# Patient Record
Sex: Female | Born: 1985 | Race: White | Hispanic: No | Marital: Married | Smoking: Never smoker
Health system: Southern US, Community
[De-identification: ages and names within clinical notes are randomized; demographics above are authoritative.]

## PROBLEM LIST (undated history)

## (undated) DIAGNOSIS — Z3400 Encounter for supervision of normal first pregnancy, unspecified trimester: Secondary | ICD-10-CM

## (undated) DIAGNOSIS — Z8709 Personal history of other diseases of the respiratory system: Secondary | ICD-10-CM

## (undated) DIAGNOSIS — G44099 Other trigeminal autonomic cephalgias (TAC), not intractable: Secondary | ICD-10-CM

## (undated) DIAGNOSIS — E162 Hypoglycemia, unspecified: Secondary | ICD-10-CM

## (undated) DIAGNOSIS — T4145XA Adverse effect of unspecified anesthetic, initial encounter: Secondary | ICD-10-CM

## (undated) DIAGNOSIS — D649 Anemia, unspecified: Secondary | ICD-10-CM

## (undated) DIAGNOSIS — T7840XA Allergy, unspecified, initial encounter: Secondary | ICD-10-CM

## (undated) DIAGNOSIS — G500 Trigeminal neuralgia: Secondary | ICD-10-CM

## (undated) DIAGNOSIS — Z3483 Encounter for supervision of other normal pregnancy, third trimester: Secondary | ICD-10-CM

## (undated) DIAGNOSIS — T8859XA Other complications of anesthesia, initial encounter: Secondary | ICD-10-CM

## (undated) HISTORY — PX: WISDOM TOOTH EXTRACTION: SHX21

## (undated) HISTORY — DX: Other complications of anesthesia, initial encounter: T88.59XA

## (undated) HISTORY — DX: Personal history of other diseases of the respiratory system: Z87.09

## (undated) HISTORY — PX: MYRINGOTOMY: SUR874

## (undated) HISTORY — DX: Allergy, unspecified, initial encounter: T78.40XA

## (undated) HISTORY — DX: Anemia, unspecified: D64.9

## (undated) HISTORY — DX: Adverse effect of unspecified anesthetic, initial encounter: T41.45XA

## (undated) HISTORY — DX: Hypoglycemia, unspecified: E16.2

## (undated) HISTORY — PX: TONSILLECTOMY: SUR1361

---
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
## 2005-11-28 ENCOUNTER — Emergency Department (HOSPITAL_COMMUNITY): Admission: EM | Admit: 2005-11-28 | Discharge: 2005-11-29 | Payer: Self-pay | Admitting: Emergency Medicine

## 2011-06-13 ENCOUNTER — Emergency Department (HOSPITAL_COMMUNITY)
Admission: EM | Admit: 2011-06-13 | Discharge: 2011-06-13 | Disposition: A | Discharge status: Home / Self Care | Payer: Self-pay | Source: Home / Self Care | Attending: Emergency Medicine | Admitting: Emergency Medicine

## 2011-06-13 ENCOUNTER — Encounter: Payer: Self-pay | Admitting: *Deleted

## 2011-06-13 DIAGNOSIS — H6692 Otitis media, unspecified, left ear: Secondary | ICD-10-CM

## 2011-06-13 DIAGNOSIS — H6690 Otitis media, unspecified, unspecified ear: Secondary | ICD-10-CM

## 2011-06-13 HISTORY — DX: Other trigeminal autonomic cephalgias (tac), not intractable: G44.099

## 2011-06-13 MED ORDER — TRAMADOL HCL 50 MG PO TABS: 100.0000 mg | ORAL_TABLET | Freq: Three times a day (TID) | ORAL | Status: AC | PRN

## 2011-06-13 MED ORDER — AMOXICILLIN 500 MG PO CAPS: 1000.0000 mg | ORAL_CAPSULE | Freq: Three times a day (TID) | ORAL | Status: AC

## 2011-06-13 NOTE — ED Notes (Signed)
Cough and congestion onset Wednesday onset of left ear pain last night -

## 2011-06-13 NOTE — ED Provider Notes (Signed)
Chief Complaint  Patient presents with  . Otalgia  . Nasal Congestion  . Cough    History of Present Illness:  Jeanice Lim has had a two-day history of left ear pain, pressure, dry cough, sore throat, and hoarseness. She denies fever, chills, nasal congestion, rhinorrhea, drainage from the ear, difficulty breathing, nausea, or vomiting.  Review of Systems:  Other than noted above, the patient denies any of the following symptoms. Systemic:  No fever, chills, sweats, fatigue, myalgias, headache, or anorexia. Eye:  No redness, pain or drainage. ENT:  No earache, nasal congestion, rhinorrhea, sinus pressure, or sore throat. Lungs:  No cough, sputum production, wheezing, shortness of breath. Or chest pain. GI:  No nausea, vomiting, abdominal pain or diarrhea. Skin:  No rash or itching.  PMFSH:  Past medical history, family history, social history, meds, and allergies were reviewed.  Physical Exam:   Vital signs:  BP 135/70  Pulse 93  Temp(Src) 98.1 F (36.7 C) (Oral)  Resp 16  SpO2 100%  LMP 05/30/2011 General:  Alert, in no distress. Eye:  No conjunctival injection or drainage. ENT:  The left TM was red and dull. The right TM was normal. There was no fluid or pus behind either TM.  Nasal mucosa was clear and uncongested, without drainage.  Mucous membranes were moist.  Pharynx was clear, without exudate or drainage.  There were no oral ulcerations or lesions. Neck:  Supple, no adenopathy, tenderness or mass. Lungs:  No respiratory distress.  Lungs were clear to auscultation, without wheezes, rales or rhonchi.  Breath sounds were clear and equal bilaterally. Heart:  Regular rhythm, without gallops, murmers or rubs. Skin:  Clear, warm, and dry, without rash or lesions.  Labs:  No results found for this or any previous visit.   Radiology:  No results found.  Medications given in UCC:  None  Assessment:   Diagnoses that have been ruled out:  Diagnoses that are still under consideration:   Final diagnoses:  Left otitis media     Plan:   1.  The following meds were prescribed:   New Prescriptions   AMOXICILLIN (AMOXIL) 500 MG CAPSULE    Take 2 capsules (1,000 mg total) by mouth 3 (three) times daily.   TRAMADOL (ULTRAM) 50 MG TABLET    Take 2 tablets (100 mg total) by mouth every 8 (eight) hours as needed for pain.   2.  The patient was instructed in symptomatic care and handouts were given. 3.  The patient was told to return if becoming worse in any way, if no better in 3 or 4 days, and given some red flag symptoms that would indicate earlier return.   Roque Lias, MD 06/13/11 2119

## 2011-09-08 ENCOUNTER — Encounter: Payer: Self-pay | Admitting: *Deleted

## 2012-06-08 NOTE — L&D Delivery Note (Signed)
Delivery Note At 9:25 [name] a viable and healthy female was delivered via Vaginal, Spontaneous Delivery (Presentation: OA;LOT ).  APGAR: 8, 9; weight P .   Placenta status: Intact, Spontaneous.  Cord: 3 vessels with the following complications: None.    Anesthesia: Epidural  Episiotomy: None Lacerations: 2nd degree Suture Repair: 3.0 vicryl rapide Est. Blood Loss (mL):   Mom to postpartum.  Baby to stay with mommy and daddy.  [name],Lisa [name], Lisa [name]  O+/Br/contra?

## 2012-07-29 ENCOUNTER — Inpatient Hospital Stay (HOSPITAL_COMMUNITY): Admission: AD | Admit: 2012-07-29 | Payer: Self-pay | Source: Ambulatory Visit | Admitting: Obstetrics and Gynecology

## 2012-09-15 ENCOUNTER — Encounter (HOSPITAL_COMMUNITY): Payer: Self-pay

## 2012-09-15 DIAGNOSIS — Z3400 Encounter for supervision of normal first pregnancy, unspecified trimester: Secondary | ICD-10-CM

## 2012-09-15 HISTORY — DX: Encounter for supervision of normal first pregnancy, unspecified trimester: Z34.00

## 2012-09-15 NOTE — H&P (Signed)
ZAMORA COLTON is a 27 y.o. female G1P0 at 28+ for iol secondary to term pregnancy, favorable cervix.  +FM, no LOF, no VB, occ ctx.  SVE 2cm.  Pregnancy relatively uncomplicated, S<D with good growth on [place].    Maternal Medical History:  Contractions: Frequency: irregular.    Fetal activity: Perceived fetal activity is normal.    Prenatal complications: no prenatal complications Prenatal Complications - Diabetes: none.    OB History   Grav Para Term Preterm Abortions TAB SAB Ect Mult Living                G1 present, no abn pap, no STDs  Past Medical History  Diagnosis Date  . Headache, trigeminal autonomic   . Hypoglycemia   . Normal pregnancy, first 09/15/2012   Past Surgical History  Procedure Laterality Date  . Tonsillectomy    . Myringotomy    WTE  Family History: family history includes Diabetes in her mother.HTN, leukemia and lung CA Social History:  reports that she has never smoked. She does not have any smokeless tobacco history on file. 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   HENT: Negative.   Eyes: Negative.   Respiratory: Negative.   Cardiovascular: Negative.   Gastrointestinal: Negative.   Genitourinary: Negative.   Musculoskeletal: Negative.   Skin: Negative.   Neurological: Negative.   Psychiatric/Behavioral: Negative.       There were no vitals taken for this visit. Maternal Exam:  Abdomen: Fundal height is 37cm.   Estimated fetal weight is  8#.   Fetal presentation: vertex  Introitus: Normal vulva. Normal vagina.  Pelvis: adequate for delivery.   Cervix: Cervix evaluated by digital exam.     Physical Exam  Constitutional: She is oriented to person, place, and time. She appears well-developed and well-nourished.  HENT:  Head: Normocephalic and atraumatic.  Cardiovascular: Normal rate and regular rhythm.   Respiratory: Effort normal and breath sounds normal. No respiratory distress.  GI: Soft. Bowel sounds are normal. There is no tenderness.  Musculoskeletal: Normal range of motion.  Neurological: She is alert and oriented to person, place, and time.  Skin: Skin is warm and dry.  Psychiatric: She has a normal mood and affect. Her behavior is normal.    Prenatal labs: ABO, Rh:  O+ Antibody:  neg Rubella:  immune RPR:   NR HBsAg:   neg HIV:   neg GBS:   neg  Hgb 12.8/PapWNL/Ur Cx neg/ Plt 251K/ GC neg/ Chl neg/ First Tri Screen WNL/ AFP WNL/glucola WNL  [place] cwd 8 wk, EDC 09/15/12 Nl anat, x isolated CP cyst, female 34 wk [place] S<D AFI ULN, post plac, vtx, female EFW 6#3  Assessment/Plan: 27yo G1P0 at 40+ iol AROM/pitocin gbbs neg - no prophylaxis Epidural prn Expect SVD   [name],Mennie Spiller 09/15/2012, 9:52 PM

## 2012-09-16 ENCOUNTER — Inpatient Hospital Stay (HOSPITAL_COMMUNITY): Payer: 59 | Admitting: Anesthesiology

## 2012-09-16 ENCOUNTER — Encounter (HOSPITAL_COMMUNITY): Payer: Self-pay

## 2012-09-16 ENCOUNTER — Encounter (HOSPITAL_COMMUNITY): Payer: Self-pay | Admitting: Anesthesiology

## 2012-09-16 ENCOUNTER — Inpatient Hospital Stay (HOSPITAL_COMMUNITY)
Admission: RE | Admit: 2012-09-16 | Discharge: 2012-09-18 | DRG: 775 | Disposition: A | Discharge status: Home / Self Care | Payer: 59 | Source: Ambulatory Visit | Attending: Obstetrics and Gynecology | Admitting: Obstetrics and Gynecology

## 2012-09-16 VITALS — BP 130/85 | HR 91 | Temp 98.4°F | Resp 18 | Ht 72.0 in | Wt 187.0 lb

## 2012-09-16 DIAGNOSIS — Z3403 Encounter for supervision of normal first pregnancy, third trimester: Secondary | ICD-10-CM

## 2012-09-16 HISTORY — DX: Encounter for supervision of normal first pregnancy, unspecified trimester: Z34.00

## 2012-09-16 LAB — OB RESULTS CONSOLE ANTIBODY SCREEN: Antibody Screen: NEGATIVE

## 2012-09-16 LAB — OB RESULTS CONSOLE ABO/RH: RH Type: POSITIVE

## 2012-09-16 LAB — CBC
MCHC: 34.6 g/dL (ref 30.0–36.0)
Platelets: 203 10*3/uL (ref 150–400)
RDW: 13.7 % (ref 11.5–15.5)
WBC: 11.7 10*3/uL — ABNORMAL HIGH (ref 4.0–10.5)

## 2012-09-16 LAB — OB RESULTS CONSOLE GBS: GBS: NEGATIVE

## 2012-09-16 LAB — RPR: RPR Ser Ql: NONREACTIVE

## 2012-09-16 MED ORDER — PRENATAL MULTIVITAMIN CH
1.0000 | ORAL_TABLET | Freq: Every day | ORAL | Status: DC
  Administered 2012-09-17: 1 via ORAL
  Filled 2012-09-16: qty 1

## 2012-09-16 MED ORDER — LACTATED RINGERS IV SOLN: 500.0000 mL | Freq: Once | INTRAVENOUS | Status: DC

## 2012-09-16 MED ORDER — BENZOCAINE-MENTHOL 20-0.5 % EX AERO
1.0000 "application " | INHALATION_SPRAY | CUTANEOUS | Status: DC | PRN
  Administered 2012-09-17: 1 via TOPICAL
  Filled 2012-09-16: qty 56

## 2012-09-16 MED ORDER — LACTATED RINGERS IV SOLN
500.0000 mL | INTRAVENOUS | Status: DC | PRN
  Administered 2012-09-16: 500 mL via INTRAVENOUS

## 2012-09-16 MED ORDER — ACETAMINOPHEN 325 MG PO TABS: 650.0000 mg | ORAL_TABLET | ORAL | Status: DC | PRN

## 2012-09-16 MED ORDER — LANOLIN HYDROUS EX OINT: TOPICAL_OINTMENT | CUTANEOUS | Status: DC | PRN

## 2012-09-16 MED ORDER — ONDANSETRON HCL 4 MG/2ML IJ SOLN
4.0000 mg | Freq: Four times a day (QID) | INTRAMUSCULAR | Status: DC | PRN
  Administered 2012-09-16: 4 mg via INTRAVENOUS
  Filled 2012-09-16: qty 2

## 2012-09-16 MED ORDER — LACTATED RINGERS IV SOLN: INTRAVENOUS | Status: DC

## 2012-09-16 MED ORDER — FENTANYL 2.5 MCG/ML BUPIVACAINE 1/10 % EPIDURAL INFUSION (WH - ANES)
14.0000 mL/h | INTRAMUSCULAR | Status: DC | PRN
  Filled 2012-09-16: qty 125

## 2012-09-16 MED ORDER — ZOLPIDEM TARTRATE 5 MG PO TABS: 5.0000 mg | ORAL_TABLET | Freq: Every evening | ORAL | Status: DC | PRN

## 2012-09-16 MED ORDER — FAMOTIDINE 20 MG PO TABS
20.0000 mg | ORAL_TABLET | Freq: Every day | ORAL | Status: DC
  Administered 2012-09-17: 20 mg via ORAL
  Filled 2012-09-16: qty 1

## 2012-09-16 MED ORDER — TERBUTALINE SULFATE 1 MG/ML IJ SOLN: 0.2500 mg | Freq: Once | INTRAMUSCULAR | Status: AC | PRN

## 2012-09-16 MED ORDER — LIDOCAINE HCL (PF) 1 % IJ SOLN
INTRAMUSCULAR | Status: DC | PRN
  Administered 2012-09-16 (×2): 5 mL

## 2012-09-16 MED ORDER — OXYTOCIN 40 UNITS IN LACTATED RINGERS INFUSION - SIMPLE MED
1.0000 m[IU]/min | INTRAVENOUS | Status: DC
  Administered 2012-09-16: 2 m[IU]/min via INTRAVENOUS
  Administered 2012-09-16: 999 m[IU]/min via INTRAVENOUS
  Filled 2012-09-16: qty 1000

## 2012-09-16 MED ORDER — WITCH HAZEL-GLYCERIN EX PADS: 1.0000 "application " | MEDICATED_PAD | CUTANEOUS | Status: DC | PRN

## 2012-09-16 MED ORDER — ONDANSETRON HCL 4 MG PO TABS: 4.0000 mg | ORAL_TABLET | ORAL | Status: DC | PRN

## 2012-09-16 MED ORDER — OXYTOCIN BOLUS FROM INFUSION: 500.0000 mL | INTRAVENOUS | Status: DC

## 2012-09-16 MED ORDER — DIPHENHYDRAMINE HCL 25 MG PO CAPS: 25.0000 mg | ORAL_CAPSULE | Freq: Four times a day (QID) | ORAL | Status: DC | PRN

## 2012-09-16 MED ORDER — IBUPROFEN 600 MG PO TABS
600.0000 mg | ORAL_TABLET | Freq: Four times a day (QID) | ORAL | Status: DC | PRN
  Filled 2012-09-16 (×2): qty 1

## 2012-09-16 MED ORDER — IBUPROFEN 600 MG PO TABS
600.0000 mg | ORAL_TABLET | Freq: Four times a day (QID) | ORAL | Status: DC
  Administered 2012-09-17 – 2012-09-18 (×6): 600 mg via ORAL
  Filled 2012-09-16 (×5): qty 1

## 2012-09-16 MED ORDER — SODIUM BICARBONATE 8.4 % IV SOLN
INTRAVENOUS | Status: DC | PRN
  Administered 2012-09-16: 8 mL via EPIDURAL

## 2012-09-16 MED ORDER — EPHEDRINE 5 MG/ML INJ
10.0000 mg | INTRAVENOUS | Status: DC | PRN
  Filled 2012-09-16: qty 2
  Filled 2012-09-16: qty 4

## 2012-09-16 MED ORDER — SIMETHICONE 80 MG PO CHEW: 80.0000 mg | CHEWABLE_TABLET | ORAL | Status: DC | PRN

## 2012-09-16 MED ORDER — BUTORPHANOL TARTRATE 1 MG/ML IJ SOLN: 2.0000 mg | INTRAMUSCULAR | Status: DC | PRN

## 2012-09-16 MED ORDER — LACTATED RINGERS IV SOLN
INTRAVENOUS | Status: DC
  Administered 2012-09-16: 13:00:00 via INTRAUTERINE

## 2012-09-16 MED ORDER — OXYCODONE-ACETAMINOPHEN 5-325 MG PO TABS
1.0000 | ORAL_TABLET | ORAL | Status: DC | PRN
  Administered 2012-09-17: 1 via ORAL
  Filled 2012-09-16: qty 1

## 2012-09-16 MED ORDER — EPHEDRINE 5 MG/ML INJ
10.0000 mg | INTRAVENOUS | Status: DC | PRN
  Filled 2012-09-16: qty 2

## 2012-09-16 MED ORDER — OXYTOCIN 40 UNITS IN LACTATED RINGERS INFUSION - SIMPLE MED: 62.5000 mL/h | INTRAVENOUS | Status: DC

## 2012-09-16 MED ORDER — OXYCODONE-ACETAMINOPHEN 5-325 MG PO TABS: 1.0000 | ORAL_TABLET | ORAL | Status: DC | PRN

## 2012-09-16 MED ORDER — PHENYLEPHRINE 40 MCG/ML (10ML) SYRINGE FOR IV PUSH (FOR BLOOD PRESSURE SUPPORT)
80.0000 ug | PREFILLED_SYRINGE | INTRAVENOUS | Status: DC | PRN
  Filled 2012-09-16: qty 2

## 2012-09-16 MED ORDER — LIDOCAINE HCL (PF) 1 % IJ SOLN
30.0000 mL | INTRAMUSCULAR | Status: DC | PRN
  Filled 2012-09-16 (×2): qty 30

## 2012-09-16 MED ORDER — LACTATED RINGERS IV SOLN
INTRAVENOUS | Status: DC
  Administered 2012-09-16 (×2): via INTRAVENOUS

## 2012-09-16 MED ORDER — PHENYLEPHRINE 40 MCG/ML (10ML) SYRINGE FOR IV PUSH (FOR BLOOD PRESSURE SUPPORT)
80.0000 ug | PREFILLED_SYRINGE | INTRAVENOUS | Status: DC | PRN
  Filled 2012-09-16: qty 2
  Filled 2012-09-16: qty 5

## 2012-09-16 MED ORDER — CITRIC ACID-SODIUM CITRATE 334-500 MG/5ML PO SOLN: 30.0000 mL | ORAL | Status: DC | PRN

## 2012-09-16 MED ORDER — FENTANYL 2.5 MCG/ML BUPIVACAINE 1/10 % EPIDURAL INFUSION (WH - ANES)
16.0000 mL/h | INTRAMUSCULAR | Status: DC | PRN
  Administered 2012-09-16: 16 mL/h via EPIDURAL
  Filled 2012-09-16: qty 125

## 2012-09-16 MED ORDER — FENTANYL 2.5 MCG/ML BUPIVACAINE 1/10 % EPIDURAL INFUSION (WH - ANES)
INTRAMUSCULAR | Status: DC | PRN
  Administered 2012-09-16: 16 mL/h via EPIDURAL

## 2012-09-16 MED ORDER — SENNOSIDES-DOCUSATE SODIUM 8.6-50 MG PO TABS
2.0000 | ORAL_TABLET | Freq: Every day | ORAL | Status: DC
  Administered 2012-09-17: 2 via ORAL

## 2012-09-16 MED ORDER — DIPHENHYDRAMINE HCL 50 MG/ML IJ SOLN: 12.5000 mg | INTRAMUSCULAR | Status: DC | PRN

## 2012-09-16 MED ORDER — ONDANSETRON HCL 4 MG/2ML IJ SOLN: 4.0000 mg | INTRAMUSCULAR | Status: DC | PRN

## 2012-09-16 MED ORDER — DIBUCAINE 1 % RE OINT: 1.0000 "application " | TOPICAL_OINTMENT | RECTAL | Status: DC | PRN

## 2012-09-16 MED ORDER — TETANUS-DIPHTH-ACELL PERTUSSIS 5-2.5-18.5 LF-MCG/0.5 IM SUSP
0.5000 mL | Freq: Once | INTRAMUSCULAR | Status: AC
  Administered 2012-09-18: 0.5 mL via INTRAMUSCULAR
  Filled 2012-09-16: qty 0.5

## 2012-09-16 NOTE — Anesthesia Preprocedure Evaluation (Signed)
Anesthesia Evaluation  Patient identified by MRN, date of birth, ID band Patient awake    Reviewed: Allergy & Precautions, H&P , Patient's Chart, lab work & pertinent test results  Airway Mallampati: III TM Distance: >3 FB Neck ROM: full    Dental no notable dental hx. (+) Teeth Intact   Pulmonary neg pulmonary ROS,  breath sounds clear to auscultation  Pulmonary exam normal       Cardiovascular negative cardio ROS  Rhythm:regular Rate:Normal     Neuro/Psych  Headaches, Hx/o Trigeminal Neuralgia negative psych ROS   GI/Hepatic negative GI ROS, Neg liver ROS,   Endo/Other  negative endocrine ROSHypoglycemia  Renal/GU negative Renal ROS  negative genitourinary   Musculoskeletal   Abdominal Normal abdominal exam  (+)   Peds  Hematology negative hematology ROS (+)   Anesthesia Other Findings   Reproductive/Obstetrics (+) Pregnancy                           Anesthesia Physical Anesthesia Plan  ASA: II  Anesthesia Plan: Epidural   Post-op Pain Management:    Induction:   Airway Management Planned:   Additional Equipment:   Intra-op Plan:   Post-operative Plan:   Informed Consent: I have reviewed the patients History and Physical, chart, labs and discussed the procedure including the risks, benefits and alternatives for the proposed anesthesia with the patient or authorized representative who has indicated his/her understanding and acceptance.     Plan Discussed with: Anesthesiologist  Anesthesia Plan Comments:         Anesthesia Quick Evaluation

## 2012-09-16 NOTE — Anesthesia Procedure Notes (Signed)
Epidural Patient location during procedure: OB Start time: 09/16/2012 10:20 AM  Staffing Anesthesiologist: Skyeler Smola A. Performed by: anesthesiologist   Preanesthetic Checklist Completed: patient identified, site marked, surgical consent, pre-op evaluation, timeout performed, IV checked, risks and benefits discussed and monitors and equipment checked  Epidural Patient position: sitting Prep: site prepped and draped and DuraPrep Patient monitoring: continuous pulse ox and blood pressure Approach: midline Injection technique: LOR air  Needle:  Needle type: Tuohy  Needle gauge: 17 G Needle length: 9 cm and 9 Needle insertion depth: 5 cm cm Catheter type: closed end flexible Catheter size: 19 Gauge Catheter at skin depth: 10 cm Test dose: negative and Other  Assessment Events: blood not aspirated, injection not painful, no injection resistance, negative IV test and no paresthesia  Additional Notes Patient identified. Risks and benefits discussed including failed block, incomplete  Pain control, post dural puncture headache, nerve damage, paralysis, blood pressure Changes, nausea, vomiting, reactions to medications-both toxic and allergic and post Partum back pain. All questions were answered. Patient expressed understanding and wished to proceed. Sterile technique was used throughout procedure. Epidural site was Dressed with sterile barrier dressing. No paresthesias, signs of intravascular injection Or signs of intrathecal spread were encountered.  Patient was more comfortable after the epidural was dosed. Please see RN's note for documentation of vital signs and FHR which are stable.

## 2012-09-16 NOTE — Progress Notes (Signed)
Patient ID: Lisa [name], female   DOB: September 15, 1985, 27 y.o.   MRN: [id number]  No c/o's,   AFVSS gen NAD FHTs 120's R toco irr, q 2-34min  SVE 3/50/-2 AROM for light meconium, d/w pt  Expect SVD Augment with pitocin

## 2012-09-16 NOTE — Progress Notes (Signed)
Patient ID: Lisa [name], female   DOB: 1986/01/15, 27 y.o.   MRN: [id number]  No c/o's.  Comfortable with epidural  AFVSS gen NAD FHTs 150's, mod var toco q 2-68min  IUPC with amniinfusion.   SVE 8/90/0  27yo G1P0 at 40+ for iol Expect SVD Anticipate pushing relatively soon

## 2012-09-16 NOTE — Progress Notes (Signed)
Patient ID: Lisa [name], female   DOB: [birth year], 27 y.o.   MRN: [id number]  Comfortable with epidural  AFVSS  gen NAD FHTs 145 mod variability, occ late and early decel toco q  SVE 4.5/70/0, mod meconium noted - d/w pt    IUPC placed without difficulty or complication  Continue current mgmt Pitocin Expect SVD D/w pt still latent phase

## 2012-09-17 LAB — CBC
HCT: 34.5 % — ABNORMAL LOW (ref 36.0–46.0)
Hemoglobin: 11.5 g/dL — ABNORMAL LOW (ref 12.0–15.0)
RDW: 13.6 % (ref 11.5–15.5)
WBC: 18.8 10*3/uL — ABNORMAL HIGH (ref 4.0–10.5)

## 2012-09-17 LAB — ABO/RH: ABO/RH(D): O POS

## 2012-09-17 NOTE — Progress Notes (Signed)
Post Partum Day 1 Subjective: no complaints, up ad lib, voiding, tolerating PO and nl lochia, pain controlled  Objective: Blood pressure 130/79, pulse 84, temperature 98.1 F (36.7 C), temperature source Oral, resp. rate 18, height 6' (1.829 m), weight 84.823 kg (187 lb), SpO2 97.00%, unknown if currently breastfeeding.  Physical Exam:  General: alert and no distress Lochia: appropriate Uterine Fundus: firm   Recent Labs  09/16/12 0810 09/17/12 0702  HGB 13.3 11.5*  HCT 38.4 34.5*    Assessment/Plan: Plan for discharge tomorrow, Breastfeeding and Lactation consult.   Routine care.     LOS: 1 day   [name],Hampton Wixom 09/17/2012, 7:42 AM

## 2012-09-17 NOTE — Anesthesia Postprocedure Evaluation (Signed)
  Anesthesia Post-op Note  Patient: Lisa [name]  Procedure(s) Performed: * No procedures listed *  Patient Location: Mother/Baby  Anesthesia Type:Epidural  Level of Consciousness: awake, alert  and oriented  Airway and Oxygen Therapy: Patient Spontanous Breathing  Post-op Pain: mild  Post-op Assessment: Patient's Cardiovascular Status Stable, Respiratory Function Stable, Patent Airway, No signs of Nausea or vomiting, Pain level controlled, No headache, No residual numbness and No residual motor weakness  Post-op Vital Signs: Reviewed and stable  Complications: No apparent anesthesia complications

## 2012-09-18 MED ORDER — IBUPROFEN 800 MG PO TABS: 800.0000 mg | ORAL_TABLET | Freq: Three times a day (TID) | ORAL | Status: DC | PRN

## 2012-09-18 MED ORDER — OXYCODONE-ACETAMINOPHEN 5-325 MG PO TABS: 1.0000 | ORAL_TABLET | Freq: Four times a day (QID) | ORAL | Status: DC | PRN

## 2012-09-18 MED ORDER — PRENATAL MULTIVITAMIN CH: 1.0000 | ORAL_TABLET | Freq: Every day | ORAL | Status: DC

## 2012-09-18 NOTE — Progress Notes (Signed)
Post Partum Day 2 Subjective: no complaints, up ad lib, voiding, tolerating PO and nl lochia, pain controlled  Objective: Blood pressure 130/85, pulse 91, temperature 98.4 F (36.9 C), temperature source Oral, resp. rate 18, height 6' (1.829 m), weight 84.823 kg (187 lb), SpO2 97.00%, unknown if currently breastfeeding.  Physical Exam:  General: alert and no distress Lochia: appropriate Uterine Fundus: firm   Recent Labs  09/16/12 0810 09/17/12 0702  HGB 13.3 11.5*  HCT 38.4 34.5*    Assessment/Plan: Discharge home, Breastfeeding and Lactation consult.  Pain controlled, nl lochia.     LOS: 2 days   [name],Riggins Cisek 09/18/2012, 8:56 AM

## 2012-09-18 NOTE — Discharge Summary (Signed)
Obstetric Discharge Summary Reason for Admission: induction of labor Prenatal Procedures: none Intrapartum Procedures: spontaneous vaginal delivery Postpartum Procedures: none Complications-Operative and Postpartum: 2nd degree perineal laceration Hemoglobin  Date Value Range Status  09/17/2012 11.5* 12.0 - 15.0 g/dL Final     HCT  Date Value Range Status  09/17/2012 34.5* 36.0 - 46.0 % Final    Physical Exam:  General: alert and no distress Lochia: appropriate Uterine Fundus: firm  Discharge Diagnoses: Term Pregnancy-delivered  Discharge Information: Date: 09/18/2012 Activity: pelvic rest Diet: routine Medications: PNV, Ibuprofen and Percocet Condition: stable Instructions: refer to practice specific booklet Discharge to: home Follow-up Information   Follow up with [name],Ainslee Sou, MD. Schedule an appointment as soon as possible for a visit in 6 weeks.   Contact information:   [address] [place] [address] [phone]       Newborn Data: Live born female  Birth Weight: 8 lb 3.9 oz (3740 g) APGAR: 8, 9  Home with mother.  [name],Tal Neer 09/18/2012, 9:18 AM

## 2012-09-20 NOTE — Progress Notes (Signed)
Post discharge chart review completed per insurance co request. 

## 2014-04-09 ENCOUNTER — Encounter (HOSPITAL_COMMUNITY): Payer: Self-pay

## 2015-06-09 NOTE — L&D Delivery Note (Signed)
Delivery Note At 8:45 PM a viable and healthy female was delivered via Vaginal, Spontaneous Delivery (Presentation: OA; ROT).  APGAR: 9, 9; weight  .   Placenta status: delivered, intact .  Cord: 3VC  with the following complications:  none .    Anesthesia:  epidural Episiotomy: None Lacerations: 1st degree;Perineal Suture Repair: 3.0 vicryl rapide Est. Blood Loss (mL): 150cc  Mom to postpartum.  Baby to Couplet care / Skin to Skin.  [name]-Stuckert, Lanissa Cashen 04/11/2016, 9:09 PM  RI/Tdap in PNC/O+/Br/Condoms/

## 2015-08-21 DIAGNOSIS — N911 Secondary amenorrhea: Secondary | ICD-10-CM | POA: Diagnosis not present

## 2015-08-21 DIAGNOSIS — Z3201 Encounter for pregnancy test, result positive: Secondary | ICD-10-CM | POA: Diagnosis not present

## 2015-09-09 DIAGNOSIS — Z3A09 9 weeks gestation of pregnancy: Secondary | ICD-10-CM | POA: Diagnosis not present

## 2015-09-09 DIAGNOSIS — Z36 Encounter for antenatal screening of mother: Secondary | ICD-10-CM | POA: Diagnosis not present

## 2015-09-09 DIAGNOSIS — Z3481 Encounter for supervision of other normal pregnancy, first trimester: Secondary | ICD-10-CM | POA: Diagnosis not present

## 2015-09-09 DIAGNOSIS — O26891 Other specified pregnancy related conditions, first trimester: Secondary | ICD-10-CM | POA: Diagnosis not present

## 2015-09-09 LAB — OB RESULTS CONSOLE ABO/RH: "RH Type ": POSITIVE

## 2015-09-09 LAB — OB RESULTS CONSOLE GC/CHLAMYDIA
Chlamydia: NEGATIVE
Gonorrhea: NEGATIVE

## 2015-09-09 LAB — OB RESULTS CONSOLE HIV ANTIBODY (ROUTINE TESTING): HIV: NONREACTIVE

## 2015-09-09 LAB — OB RESULTS CONSOLE RUBELLA ANTIBODY, IGM: Rubella: IMMUNE

## 2015-09-09 LAB — OB RESULTS CONSOLE HEPATITIS B SURFACE ANTIGEN: Hepatitis B Surface Ag: NEGATIVE

## 2015-09-09 LAB — OB RESULTS CONSOLE RPR: RPR: NONREACTIVE

## 2015-09-09 LAB — OB RESULTS CONSOLE ANTIBODY SCREEN: Antibody Screen: NEGATIVE

## 2015-09-30 DIAGNOSIS — Z36 Encounter for antenatal screening of mother: Secondary | ICD-10-CM | POA: Diagnosis not present

## 2015-09-30 DIAGNOSIS — Z3A12 12 weeks gestation of pregnancy: Secondary | ICD-10-CM | POA: Diagnosis not present

## 2015-10-07 DIAGNOSIS — Z3A13 13 weeks gestation of pregnancy: Secondary | ICD-10-CM | POA: Diagnosis not present

## 2015-10-07 DIAGNOSIS — Z3481 Encounter for supervision of other normal pregnancy, first trimester: Secondary | ICD-10-CM | POA: Diagnosis not present

## 2015-11-16 ENCOUNTER — Encounter (HOSPITAL_COMMUNITY): Payer: Self-pay | Admitting: *Deleted

## 2015-11-16 ENCOUNTER — Observation Stay (HOSPITAL_COMMUNITY)
Admission: EM | Admit: 2015-11-16 | Discharge: 2015-11-18 | Disposition: A | Discharge status: Home / Self Care | Payer: 59 | Attending: General Surgery | Admitting: General Surgery

## 2015-11-16 ENCOUNTER — Emergency Department (HOSPITAL_COMMUNITY): Payer: 59

## 2015-11-16 DIAGNOSIS — S270XXA Traumatic pneumothorax, initial encounter: Secondary | ICD-10-CM | POA: Diagnosis not present

## 2015-11-16 DIAGNOSIS — J939 Pneumothorax, unspecified: Secondary | ICD-10-CM | POA: Diagnosis not present

## 2015-11-16 DIAGNOSIS — Z3490 Encounter for supervision of normal pregnancy, unspecified, unspecified trimester: Secondary | ICD-10-CM

## 2015-11-16 DIAGNOSIS — Y9289 Other specified places as the place of occurrence of the external cause: Secondary | ICD-10-CM | POA: Diagnosis not present

## 2015-11-16 DIAGNOSIS — O9989 Other specified diseases and conditions complicating pregnancy, childbirth and the puerperium: Secondary | ICD-10-CM | POA: Diagnosis not present

## 2015-11-16 DIAGNOSIS — Z888 Allergy status to other drugs, medicaments and biological substances status: Secondary | ICD-10-CM | POA: Diagnosis not present

## 2015-11-16 DIAGNOSIS — Z3A19 19 weeks gestation of pregnancy: Secondary | ICD-10-CM | POA: Insufficient documentation

## 2015-11-16 DIAGNOSIS — M25551 Pain in right hip: Secondary | ICD-10-CM | POA: Diagnosis not present

## 2015-11-16 DIAGNOSIS — S279XXA Injury of unspecified intrathoracic organ, initial encounter: Secondary | ICD-10-CM | POA: Diagnosis not present

## 2015-11-16 DIAGNOSIS — S2231XA Fracture of one rib, right side, initial encounter for closed fracture: Secondary | ICD-10-CM | POA: Diagnosis not present

## 2015-11-16 DIAGNOSIS — O9A212 Injury, poisoning and certain other consequences of external causes complicating pregnancy, second trimester: Secondary | ICD-10-CM | POA: Diagnosis not present

## 2015-11-16 DIAGNOSIS — S79911A Unspecified injury of right hip, initial encounter: Secondary | ICD-10-CM | POA: Diagnosis not present

## 2015-11-16 DIAGNOSIS — Y998 Other external cause status: Secondary | ICD-10-CM | POA: Diagnosis not present

## 2015-11-16 DIAGNOSIS — Y9389 Activity, other specified: Secondary | ICD-10-CM | POA: Insufficient documentation

## 2015-11-16 DIAGNOSIS — S299XXA Unspecified injury of thorax, initial encounter: Secondary | ICD-10-CM | POA: Diagnosis not present

## 2015-11-16 HISTORY — DX: Trigeminal neuralgia: G50.0

## 2015-11-16 LAB — COMPREHENSIVE METABOLIC PANEL
ALBUMIN: 3.9 g/dL (ref 3.5–5.0)
ALK PHOS: 92 U/L (ref 38–126)
ALT: 191 U/L — ABNORMAL HIGH (ref 14–54)
ANION GAP: 8 (ref 5–15)
AST: 314 U/L — ABNORMAL HIGH (ref 15–41)
BUN: 8 mg/dL (ref 6–20)
CHLORIDE: 106 mmol/L (ref 101–111)
CO2: 21 mmol/L — AB (ref 22–32)
Calcium: 9.9 mg/dL (ref 8.9–10.3)
Creatinine, Ser: 0.57 mg/dL (ref 0.44–1.00)
GFR calc Af Amer: >60 mL/min (ref >60)
GFR calc non Af Amer: >60 mL/min (ref >60)
GLUCOSE: 107 mg/dL — AB (ref 65–99)
POTASSIUM: 3.4 mmol/L — AB (ref 3.5–5.1)
SODIUM: 135 mmol/L (ref 135–145)
Total Bilirubin: 0.8 mg/dL (ref 0.3–1.2)
Total Protein: 7 g/dL (ref 6.5–8.1)

## 2015-11-16 LAB — CBC WITH DIFFERENTIAL/PLATELET
BASOS ABS: 0 10*3/uL (ref 0.0–0.1)
Basophils Relative: 0 %
Eosinophils Absolute: 0.1 10*3/uL (ref 0.0–0.7)
Eosinophils Relative: 0 %
HEMATOCRIT: 36.1 % (ref 36.0–46.0)
Hemoglobin: 12 g/dL (ref 12.0–15.0)
LYMPHS ABS: 2.8 10*3/uL (ref 0.7–4.0)
Lymphocytes Relative: 20 %
MCH: 28.9 pg (ref 26.0–34.0)
MCHC: 33.2 g/dL (ref 30.0–36.0)
MCV: 87 fL (ref 78.0–100.0)
MONO ABS: 0.6 10*3/uL (ref 0.1–1.0)
Monocytes Relative: 4 %
NEUTROS PCT: 76 %
Neutro Abs: 10.8 10*3/uL — ABNORMAL HIGH (ref 1.7–7.7)
Platelets: 290 10*3/uL (ref 150–400)
RBC: 4.15 MIL/uL (ref 3.87–5.11)
RDW: 13.5 % (ref 11.5–15.5)
WBC: 14.3 10*3/uL — AB (ref 4.0–10.5)

## 2015-11-16 LAB — TYPE AND SCREEN
ABO/RH(D): O POS
ANTIBODY SCREEN: NEGATIVE

## 2015-11-16 MED ORDER — SODIUM CHLORIDE 0.9 % IV BOLUS (SEPSIS)
1000.0000 mL | Freq: Once | INTRAVENOUS | Status: AC
  Administered 2015-11-16: 1000 mL via INTRAVENOUS

## 2015-11-16 NOTE — ED Notes (Signed)
[facility] paged per provider request
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
## 2015-11-16 NOTE — Progress Notes (Signed)
   11/16/15 2230  Clinical Encounter Type  Visited With Patient;Family  Visit Type ED  Referral From Nurse  Spiritual Encounters  Spiritual Needs Emotional  Stress Factors  Family Stress Factors Lack of knowledge  Advance Directives (For Healthcare)  Does patient have an advance directive? No  Would patient like information on creating an advanced directive? No - patient declined information  Chaplain facilitated sharing of information with patient's parents and inlaws and visit to patient by them. Paislynn Hegstrom, Chaplain
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
## 2015-11-17 ENCOUNTER — Observation Stay (HOSPITAL_COMMUNITY): Payer: 59

## 2015-11-17 DIAGNOSIS — R079 Chest pain, unspecified: Secondary | ICD-10-CM | POA: Diagnosis not present

## 2015-11-17 DIAGNOSIS — O9989 Other specified diseases and conditions complicating pregnancy, childbirth and the puerperium: Secondary | ICD-10-CM | POA: Diagnosis not present

## 2015-11-17 DIAGNOSIS — M25551 Pain in right hip: Secondary | ICD-10-CM | POA: Diagnosis not present

## 2015-11-17 DIAGNOSIS — Z3A19 19 weeks gestation of pregnancy: Secondary | ICD-10-CM | POA: Diagnosis not present

## 2015-11-17 DIAGNOSIS — Z888 Allergy status to other drugs, medicaments and biological substances status: Secondary | ICD-10-CM | POA: Diagnosis not present

## 2015-11-17 DIAGNOSIS — S270XXA Traumatic pneumothorax, initial encounter: Secondary | ICD-10-CM | POA: Diagnosis not present

## 2015-11-17 DIAGNOSIS — J939 Pneumothorax, unspecified: Secondary | ICD-10-CM | POA: Diagnosis not present

## 2015-11-17 LAB — ABO/RH: ABO/RH(D): O POS

## 2015-11-17 MED ORDER — SODIUM CHLORIDE 0.9 % IV SOLN: INTRAVENOUS | Status: DC

## 2015-11-17 MED ORDER — MORPHINE SULFATE (PF) 2 MG/ML IV SOLN: 2.0000 mg | INTRAVENOUS | Status: DC | PRN

## 2015-11-17 MED ORDER — HYDROCODONE-ACETAMINOPHEN 5-325 MG PO TABS: 2.0000 | ORAL_TABLET | ORAL | Status: DC | PRN

## 2015-11-17 MED ORDER — ACETAMINOPHEN 325 MG PO TABS
650.0000 mg | ORAL_TABLET | ORAL | Status: DC | PRN
  Administered 2015-11-17 – 2015-11-18 (×4): 650 mg via ORAL
  Filled 2015-11-17 (×5): qty 2

## 2015-11-17 MED ORDER — ONDANSETRON HCL 4 MG/2ML IJ SOLN: 4.0000 mg | Freq: Four times a day (QID) | INTRAMUSCULAR | Status: DC | PRN

## 2015-11-17 MED ORDER — ONDANSETRON HCL 4 MG PO TABS: 4.0000 mg | ORAL_TABLET | Freq: Four times a day (QID) | ORAL | Status: DC | PRN

## 2015-11-17 MED ORDER — HYDROCODONE-ACETAMINOPHEN 5-325 MG PO TABS
1.0000 | ORAL_TABLET | ORAL | Status: DC | PRN
  Administered 2015-11-17 – 2015-11-18 (×2): 1 via ORAL
  Filled 2015-11-17 (×2): qty 1
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
## 2015-11-17 NOTE — Progress Notes (Signed)
Patient ID: Lisa [name], female   DOB: [birth year], 30 y.o.   MRN: [id number] CXR reviewed with radiologist. R PTX is a little bigger. No SOB. Lung sounds equal. No need for chest tube but need to keep her until tomorrow. Will get CXR in AM and hope to D/C in time for OB appointment at 1045. I also spoke with her husband. [place] O2.  Violeta GelinasBurke Valda Christenson, MD, MPH, FACS Trauma: [phone] General Surgery: [phone]
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
## 2015-11-18 ENCOUNTER — Observation Stay (HOSPITAL_COMMUNITY): Payer: 59

## 2015-11-18 ENCOUNTER — Encounter: Payer: Self-pay | Admitting: Orthopedic Surgery

## 2015-11-18 DIAGNOSIS — Z3490 Encounter for supervision of normal pregnancy, unspecified, unspecified trimester: Secondary | ICD-10-CM

## 2015-11-18 DIAGNOSIS — Z3A19 19 weeks gestation of pregnancy: Secondary | ICD-10-CM | POA: Diagnosis not present

## 2015-11-18 DIAGNOSIS — S270XXA Traumatic pneumothorax, initial encounter: Secondary | ICD-10-CM | POA: Diagnosis not present

## 2015-11-18 DIAGNOSIS — Z3482 Encounter for supervision of other normal pregnancy, second trimester: Secondary | ICD-10-CM | POA: Diagnosis not present

## 2015-11-18 DIAGNOSIS — O9989 Other specified diseases and conditions complicating pregnancy, childbirth and the puerperium: Secondary | ICD-10-CM | POA: Diagnosis not present

## 2015-11-18 DIAGNOSIS — Z888 Allergy status to other drugs, medicaments and biological substances status: Secondary | ICD-10-CM | POA: Diagnosis not present

## 2015-11-18 DIAGNOSIS — M25551 Pain in right hip: Secondary | ICD-10-CM | POA: Diagnosis not present

## 2015-11-18 DIAGNOSIS — J939 Pneumothorax, unspecified: Secondary | ICD-10-CM | POA: Diagnosis not present

## 2015-11-18 DIAGNOSIS — Z36 Encounter for antenatal screening of mother: Secondary | ICD-10-CM | POA: Diagnosis not present

## 2015-11-18 DIAGNOSIS — R079 Chest pain, unspecified: Secondary | ICD-10-CM | POA: Diagnosis not present

## 2015-11-18 LAB — COMPREHENSIVE METABOLIC PANEL
ALK PHOS: 97 U/L (ref 38–126)
ALT: 139 U/L — AB (ref 14–54)
AST: 95 U/L — ABNORMAL HIGH (ref 15–41)
Albumin: 3.1 g/dL — ABNORMAL LOW (ref 3.5–5.0)
Anion gap: 10 (ref 5–15)
BUN: <5 mg/dL — ABNORMAL LOW (ref 6–20)
CALCIUM: 8.9 mg/dL (ref 8.9–10.3)
CHLORIDE: 106 mmol/L (ref 101–111)
CO2: 21 mmol/L — ABNORMAL LOW (ref 22–32)
CREATININE: 0.51 mg/dL (ref 0.44–1.00)
Glucose, Bld: 146 mg/dL — ABNORMAL HIGH (ref 65–99)
Potassium: 3.3 mmol/L — ABNORMAL LOW (ref 3.5–5.1)
Sodium: 137 mmol/L (ref 135–145)
Total Bilirubin: 0.7 mg/dL (ref 0.3–1.2)
Total Protein: 6 g/dL — ABNORMAL LOW (ref 6.5–8.1)

## 2015-11-18 LAB — CBC
HCT: 33.9 % — ABNORMAL LOW (ref 36.0–46.0)
Hemoglobin: 11.1 g/dL — ABNORMAL LOW (ref 12.0–15.0)
MCH: 29.1 pg (ref 26.0–34.0)
MCHC: 32.7 g/dL (ref 30.0–36.0)
MCV: 89 fL (ref 78.0–100.0)
PLATELETS: 206 10*3/uL (ref 150–400)
RBC: 3.81 MIL/uL — AB (ref 3.87–5.11)
RDW: 14 % (ref 11.5–15.5)
WBC: 10.4 10*3/uL (ref 4.0–10.5)

## 2015-11-18 MED ORDER — HYDROCODONE-ACETAMINOPHEN 5-325 MG PO TABS: 1.0000 | ORAL_TABLET | ORAL | Status: DC | PRN

## 2015-11-18 NOTE — Discharge Instructions (Signed)
No driving while taking hydrocodone. °
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
## 2015-11-18 NOTE — Progress Notes (Signed)
Discussed discharge summary with patient. Reviewed all medications with patient. Patient received Rx and work note. Patient questions answered accordingly.  Patient ready for discharge.
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
## 2015-11-21 ENCOUNTER — Encounter (HOSPITAL_COMMUNITY): Payer: Self-pay

## 2015-11-26 ENCOUNTER — Ambulatory Visit (HOSPITAL_COMMUNITY)
Admission: RE | Admit: 2015-11-26 | Discharge: 2015-11-26 | Disposition: A | Discharge status: Home / Self Care | Payer: 59 | Source: Ambulatory Visit | Attending: Orthopedic Surgery | Admitting: Orthopedic Surgery

## 2015-11-26 ENCOUNTER — Other Ambulatory Visit: Payer: Self-pay | Admitting: Orthopedic Surgery

## 2015-11-26 DIAGNOSIS — X58XXXD Exposure to other specified factors, subsequent encounter: Secondary | ICD-10-CM | POA: Diagnosis not present

## 2015-11-26 DIAGNOSIS — S270XXD Traumatic pneumothorax, subsequent encounter: Secondary | ICD-10-CM | POA: Diagnosis not present

## 2015-11-26 DIAGNOSIS — J939 Pneumothorax, unspecified: Secondary | ICD-10-CM | POA: Diagnosis not present

## 2015-11-26 DIAGNOSIS — R918 Other nonspecific abnormal finding of lung field: Secondary | ICD-10-CM | POA: Diagnosis not present

## 2015-11-27 DIAGNOSIS — J939 Pneumothorax, unspecified: Secondary | ICD-10-CM | POA: Diagnosis not present

## 2016-01-13 DIAGNOSIS — Z36 Encounter for antenatal screening of mother: Secondary | ICD-10-CM | POA: Diagnosis not present

## 2016-01-14 DIAGNOSIS — Z3A27 27 weeks gestation of pregnancy: Secondary | ICD-10-CM | POA: Diagnosis not present

## 2016-01-14 DIAGNOSIS — Z23 Encounter for immunization: Secondary | ICD-10-CM | POA: Diagnosis not present

## 2016-01-14 DIAGNOSIS — Z3482 Encounter for supervision of other normal pregnancy, second trimester: Secondary | ICD-10-CM | POA: Diagnosis not present

## 2016-03-12 DIAGNOSIS — Z369 Encounter for antenatal screening, unspecified: Secondary | ICD-10-CM | POA: Diagnosis not present

## 2016-03-12 LAB — OB RESULTS CONSOLE GBS: STREP GROUP B AG: POSITIVE

## 2016-04-03 ENCOUNTER — Encounter (HOSPITAL_COMMUNITY): Payer: Self-pay | Admitting: *Deleted

## 2016-04-03 ENCOUNTER — Telehealth (HOSPITAL_COMMUNITY): Payer: Self-pay | Admitting: *Deleted

## 2016-04-03 NOTE — Telephone Encounter (Signed)
Preadmission screen  

## 2016-04-06 ENCOUNTER — Encounter (HOSPITAL_COMMUNITY): Payer: Self-pay | Admitting: *Deleted

## 2016-04-06 ENCOUNTER — Telehealth (HOSPITAL_COMMUNITY): Payer: Self-pay | Admitting: *Deleted

## 2016-04-06 NOTE — Telephone Encounter (Signed)
Preadmission screen  

## 2016-04-10 ENCOUNTER — Encounter (HOSPITAL_COMMUNITY): Payer: Self-pay

## 2016-04-10 DIAGNOSIS — Z3483 Encounter for supervision of other normal pregnancy, third trimester: Secondary | ICD-10-CM

## 2016-04-10 HISTORY — DX: Encounter for supervision of other normal pregnancy, third trimester: Z34.83
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
## 2016-04-11 ENCOUNTER — Encounter (HOSPITAL_COMMUNITY): Payer: Self-pay | Admitting: *Deleted

## 2016-04-11 ENCOUNTER — Inpatient Hospital Stay (HOSPITAL_COMMUNITY): Payer: 59 | Admitting: Anesthesiology

## 2016-04-11 ENCOUNTER — Inpatient Hospital Stay (HOSPITAL_COMMUNITY)
Admission: RE | Admit: 2016-04-11 | Discharge: 2016-04-11 | Disposition: A | Discharge status: Home / Self Care | Payer: 59 | Source: Ambulatory Visit | Attending: Obstetrics and Gynecology | Admitting: Obstetrics and Gynecology

## 2016-04-11 ENCOUNTER — Inpatient Hospital Stay (HOSPITAL_COMMUNITY)
Admission: AD | Admit: 2016-04-11 | Discharge: 2016-04-13 | DRG: 775 | Disposition: A | Discharge status: Home / Self Care | Payer: 59 | Source: Ambulatory Visit | Attending: Obstetrics and Gynecology | Admitting: Obstetrics and Gynecology

## 2016-04-11 DIAGNOSIS — Z3483 Encounter for supervision of other normal pregnancy, third trimester: Secondary | ICD-10-CM

## 2016-04-11 DIAGNOSIS — O99824 Streptococcus B carrier state complicating childbirth: Secondary | ICD-10-CM | POA: Diagnosis present

## 2016-04-11 DIAGNOSIS — Z3A00 Weeks of gestation of pregnancy not specified: Secondary | ICD-10-CM | POA: Diagnosis not present

## 2016-04-11 DIAGNOSIS — Z3A39 39 weeks gestation of pregnancy: Secondary | ICD-10-CM

## 2016-04-11 DIAGNOSIS — O26893 Other specified pregnancy related conditions, third trimester: Secondary | ICD-10-CM | POA: Diagnosis present

## 2016-04-11 HISTORY — DX: Encounter for supervision of other normal pregnancy, third trimester: Z34.83

## 2016-04-11 LAB — CBC
HCT: 32.6 % — ABNORMAL LOW (ref 36.0–46.0)
HEMOGLOBIN: 11 g/dL — AB (ref 12.0–15.0)
MCH: 29.4 pg (ref 26.0–34.0)
MCHC: 33.7 g/dL (ref 30.0–36.0)
MCV: 87.2 fL (ref 78.0–100.0)
Platelets: 213 10*3/uL (ref 150–400)
RBC: 3.74 MIL/uL — AB (ref 3.87–5.11)
RDW: 14.1 % (ref 11.5–15.5)
WBC: 9.7 10*3/uL (ref 4.0–10.5)

## 2016-04-11 LAB — TYPE AND SCREEN
ABO/RH(D): O POS
Antibody Screen: NEGATIVE

## 2016-04-11 LAB — RPR: RPR: NONREACTIVE

## 2016-04-11 MED ORDER — OXYTOCIN 40 UNITS IN LACTATED RINGERS INFUSION - SIMPLE MED
1.0000 m[IU]/min | INTRAVENOUS | Status: DC
  Administered 2016-04-11: 2 m[IU]/min via INTRAVENOUS
  Filled 2016-04-11: qty 1000

## 2016-04-11 MED ORDER — ONDANSETRON HCL 4 MG/2ML IJ SOLN
4.0000 mg | Freq: Four times a day (QID) | INTRAMUSCULAR | Status: DC | PRN
  Administered 2016-04-11: 4 mg via INTRAVENOUS
  Filled 2016-04-11: qty 2

## 2016-04-11 MED ORDER — TERBUTALINE SULFATE 1 MG/ML IJ SOLN
0.2500 mg | Freq: Once | INTRAMUSCULAR | Status: DC | PRN
  Filled 2016-04-11: qty 1

## 2016-04-11 MED ORDER — LACTATED RINGERS IV SOLN
500.0000 mL | INTRAVENOUS | Status: DC | PRN
Start: 2016-04-11 — End: 2016-04-12

## 2016-04-11 MED ORDER — LACTATED RINGERS IV SOLN
INTRAVENOUS | Status: DC
  Administered 2016-04-11 (×2): via INTRAVENOUS

## 2016-04-11 MED ORDER — LIDOCAINE HCL (PF) 1 % IJ SOLN
30.0000 mL | INTRAMUSCULAR | Status: DC | PRN
  Filled 2016-04-11: qty 30

## 2016-04-11 MED ORDER — LACTATED RINGERS IV SOLN: 500.0000 mL | Freq: Once | INTRAVENOUS | Status: DC

## 2016-04-11 MED ORDER — BUTORPHANOL TARTRATE 1 MG/ML IJ SOLN
1.0000 mg | INTRAMUSCULAR | Status: DC | PRN
Start: 2016-04-11 — End: 2016-04-12

## 2016-04-11 MED ORDER — PENICILLIN G POTASSIUM 5000000 UNITS IJ SOLR
5.0000 10*6.[IU] | Freq: Once | INTRAVENOUS | Status: AC
  Administered 2016-04-11: 5 10*6.[IU] via INTRAVENOUS
  Filled 2016-04-11: qty 5

## 2016-04-11 MED ORDER — BUPIVACAINE HCL (PF) 0.25 % IJ SOLN
INTRAMUSCULAR | Status: DC | PRN
  Administered 2016-04-11 (×2): 4 mL via EPIDURAL

## 2016-04-11 MED ORDER — OXYTOCIN 40 UNITS IN LACTATED RINGERS INFUSION - SIMPLE MED: 2.5000 [IU]/h | INTRAVENOUS | Status: DC

## 2016-04-11 MED ORDER — OXYTOCIN BOLUS FROM INFUSION
500.0000 mL | Freq: Once | INTRAVENOUS | Status: AC
  Administered 2016-04-11: 500 mL via INTRAVENOUS

## 2016-04-11 MED ORDER — PHENYLEPHRINE 40 MCG/ML (10ML) SYRINGE FOR IV PUSH (FOR BLOOD PRESSURE SUPPORT)
80.0000 ug | PREFILLED_SYRINGE | INTRAVENOUS | Status: DC | PRN
  Filled 2016-04-11: qty 10
  Filled 2016-04-11: qty 5

## 2016-04-11 MED ORDER — ACETAMINOPHEN 325 MG PO TABS: 650.0000 mg | ORAL_TABLET | ORAL | Status: DC | PRN

## 2016-04-11 MED ORDER — DEXTROSE 5 % IV SOLN
2.5000 10*6.[IU] | INTRAVENOUS | Status: DC
  Administered 2016-04-11 (×2): 2.5 10*6.[IU] via INTRAVENOUS
  Filled 2016-04-11 (×7): qty 2.5

## 2016-04-11 MED ORDER — EPHEDRINE 5 MG/ML INJ
10.0000 mg | INTRAVENOUS | Status: DC | PRN
  Filled 2016-04-11: qty 4

## 2016-04-11 MED ORDER — FENTANYL CITRATE (PF) 100 MCG/2ML IJ SOLN
INTRAMUSCULAR | Status: DC | PRN
  Administered 2016-04-11 (×2): 50 ug via EPIDURAL

## 2016-04-11 MED ORDER — LIDOCAINE HCL (PF) 1 % IJ SOLN
INTRAMUSCULAR | Status: DC | PRN
  Administered 2016-04-11 (×2): 5 mL via EPIDURAL

## 2016-04-11 MED ORDER — FENTANYL CITRATE (PF) 100 MCG/2ML IJ SOLN
INTRAMUSCULAR | Status: AC
  Filled 2016-04-11: qty 2

## 2016-04-11 MED ORDER — OXYCODONE-ACETAMINOPHEN 5-325 MG PO TABS: 2.0000 | ORAL_TABLET | ORAL | Status: DC | PRN

## 2016-04-11 MED ORDER — OXYCODONE-ACETAMINOPHEN 5-325 MG PO TABS: 1.0000 | ORAL_TABLET | ORAL | Status: DC | PRN

## 2016-04-11 MED ORDER — DIPHENHYDRAMINE HCL 50 MG/ML IJ SOLN: 12.5000 mg | INTRAMUSCULAR | Status: DC | PRN

## 2016-04-11 MED ORDER — SOD CITRATE-CITRIC ACID 500-334 MG/5ML PO SOLN: 30.0000 mL | ORAL | Status: DC | PRN

## 2016-04-11 MED ORDER — FENTANYL 2.5 MCG/ML BUPIVACAINE 1/10 % EPIDURAL INFUSION (WH - ANES)
14.0000 mL/h | INTRAMUSCULAR | Status: DC | PRN
  Administered 2016-04-11: 18 mL/h via EPIDURAL
  Administered 2016-04-11: 16 mL/h via EPIDURAL
  Administered 2016-04-11: 14 mL/h via EPIDURAL
  Filled 2016-04-11 (×3): qty 100

## 2016-04-11 MED ORDER — PHENYLEPHRINE 40 MCG/ML (10ML) SYRINGE FOR IV PUSH (FOR BLOOD PRESSURE SUPPORT)
80.0000 ug | PREFILLED_SYRINGE | INTRAVENOUS | Status: DC | PRN
  Filled 2016-04-11: qty 5
  Filled 2016-04-11: qty 10

## 2016-04-11 NOTE — Progress Notes (Signed)
Patient ID: Lisa [name], female   DOB: 1986-04-07, 30 y.o.   MRN: [id number]   No c/o's  AFVSS gen NAD FHTs 150's, good var, category 1 toco irr  +GBBS - PCN for prophylaxis  AROM after 4 hours

## 2016-04-11 NOTE — Anesthesia Pain Management Evaluation Note (Signed)
  [facility] Pain Management Visit Note  Patient: Lisa BeckmannHolley L Foti, 30 y.o., female  "Hello I am a member of the anesthesia team at [facility]. We have an anesthesia team available at all times to provide care throughout the hospital, including epidural management and anesthesia for C-section. I don't know your plan for the delivery whether it a natural birth, water birth, IV sedation, nitrous supplementation, doula or epidural, but we want to meet your pain goals."   1.Was your pain managed to your expectations on prior hospitalizations?   Yes   2.What is your expectation for pain management during this hospitalization?     Epidural  3.How can we help you reach that goal? unsure  Record the patient's initial score and the patient's pain goal.   Pain: 6  Pain Goal: 8 [facility] wants you to be able to say your pain was always managed very well.  Cephus ShellingBURGER,Shaquitta Burbridge 04/11/2016

## 2016-04-11 NOTE — Progress Notes (Signed)
Patient ID: Oren BeckmannHolley L [name], female   DOB: 06/29/1985, 30 y.o.   MRN: [id number]   Late entry  Comfortable with epidural  AFVSS gen NAD FHTs 140-160, mod var, occ decels, category 1-2, now 140's, category 1 toco Q2-5, some tripling  AROM for clear fluid, w/o diff/comp  Continue current mgmt.  Strip reviewed 3:30pm.

## 2016-04-11 NOTE — Anesthesia Procedure Notes (Signed)
Epidural Patient location during procedure: OB Start time: 04/11/2016 11:39 AM End time: 04/11/2016 11:53 AM  Staffing Anesthesiologist: Heather RobertsSINGER, Lisa Haffey Performed: anesthesiologist   Preanesthetic Checklist Completed: patient identified, site marked, pre-op evaluation, timeout performed, IV checked, risks and benefits discussed and monitors and equipment checked  Epidural Patient position: sitting Prep: DuraPrep Patient monitoring: heart rate, cardiac monitor, continuous pulse ox and blood pressure Approach: midline Location: L2-L3 Injection technique: LOR saline  Needle:  Needle type: Tuohy  Needle gauge: 17 G Needle length: 9 cm Needle insertion depth: 7 cm Catheter size: 20 Guage Catheter at skin depth: 12 cm Test dose: negative and Other  Assessment Events: blood not aspirated, injection not painful, no injection resistance and negative IV test  Additional Notes Informed consent obtained prior to proceeding including risk of failure, 1% risk of PDPH, risk of minor discomfort and bruising.  Discussed rare but serious complications including epidural abscess, permanent nerve injury, epidural hematoma.  Discussed alternatives to epidural analgesia and patient desires to proceed.  Timeout performed pre-procedure verifying patient name, procedure, and platelet count.  Patient tolerated procedure well.

## 2016-04-11 NOTE — Progress Notes (Signed)
Patient ID: Lisa BeckmannHolley L Vanblarcom, female   DOB: 07/13/1985, 30 y.o.   MRN: [id number]   Comfortable with epidural  AFVSS gen NAD FHTs 135, mod/good var, category 1 toco Q 2-164min  SVE 5/70/-1  IUPC placed without difficulty or complication  Continue current mgmt

## 2016-04-11 NOTE — Anesthesia Preprocedure Evaluation (Signed)
Anesthesia Evaluation  Patient identified by MRN, date of birth, ID band Patient awake    Reviewed: Allergy & Precautions, NPO status , Patient's Chart, lab work & pertinent test results  History of Anesthesia Complications Negative for: history of anesthetic complications  Airway Mallampati: II  TM Distance: >3 FB Neck ROM: Full    Dental no notable dental hx. (+) Dental Advisory Given   Pulmonary neg pulmonary ROS,    Pulmonary exam normal        Cardiovascular negative cardio ROS Normal cardiovascular exam     Neuro/Psych Trigeminal neuralgia negative psych ROS   GI/Hepatic negative GI ROS, Neg liver ROS,   Endo/Other  negative endocrine ROS  Renal/GU negative Renal ROS  negative genitourinary   Musculoskeletal negative musculoskeletal ROS (+)   Abdominal   Peds negative pediatric ROS (+)  Hematology negative hematology ROS (+)   Anesthesia Other Findings   Reproductive/Obstetrics (+) Pregnancy                             Anesthesia Physical Anesthesia Plan  ASA: II  Anesthesia Plan: Epidural   Post-op Pain Management:    Induction:   Airway Management Planned:   Additional Equipment:   Intra-op Plan:   Post-operative Plan:   Informed Consent: I have reviewed the patients History and Physical, chart, labs and discussed the procedure including the risks, benefits and alternatives for the proposed anesthesia with the patient or authorized representative who has indicated his/her understanding and acceptance.   Dental advisory given  Plan Discussed with:   Anesthesia Plan Comments:         Anesthesia Quick Evaluation

## 2016-04-12 LAB — CBC
HCT: 28.5 % — ABNORMAL LOW (ref 36.0–46.0)
HEMOGLOBIN: 9.6 g/dL — AB (ref 12.0–15.0)
MCH: 29.4 pg (ref 26.0–34.0)
MCHC: 33.7 g/dL (ref 30.0–36.0)
MCV: 87.2 fL (ref 78.0–100.0)
PLATELETS: 171 10*3/uL (ref 150–400)
RBC: 3.27 MIL/uL — ABNORMAL LOW (ref 3.87–5.11)
RDW: 13.9 % (ref 11.5–15.5)
WBC: 12.1 10*3/uL — ABNORMAL HIGH (ref 4.0–10.5)

## 2016-04-12 MED ORDER — FAMOTIDINE 20 MG PO TABS
20.0000 mg | ORAL_TABLET | Freq: Two times a day (BID) | ORAL | Status: DC
  Administered 2016-04-12 – 2016-04-13 (×2): 20 mg via ORAL
  Filled 2016-04-12 (×3): qty 1

## 2016-04-12 MED ORDER — SENNOSIDES-DOCUSATE SODIUM 8.6-50 MG PO TABS
2.0000 | ORAL_TABLET | ORAL | Status: DC
  Administered 2016-04-12 – 2016-04-13 (×2): 2 via ORAL
  Filled 2016-04-12 (×2): qty 2

## 2016-04-12 MED ORDER — WITCH HAZEL-GLYCERIN EX PADS: 1.0000 "application " | MEDICATED_PAD | CUTANEOUS | Status: DC | PRN

## 2016-04-12 MED ORDER — MENTHOL 3 MG MT LOZG: 1.0000 | LOZENGE | OROMUCOSAL | Status: DC | PRN

## 2016-04-12 MED ORDER — DIBUCAINE 1 % RE OINT: 1.0000 "application " | TOPICAL_OINTMENT | RECTAL | Status: DC | PRN

## 2016-04-12 MED ORDER — OXYCODONE HCL 5 MG PO TABS: 5.0000 mg | ORAL_TABLET | ORAL | Status: DC | PRN

## 2016-04-12 MED ORDER — ZOLPIDEM TARTRATE 5 MG PO TABS: 5.0000 mg | ORAL_TABLET | Freq: Every evening | ORAL | Status: DC | PRN

## 2016-04-12 MED ORDER — PRENATAL MULTIVITAMIN CH
1.0000 | ORAL_TABLET | Freq: Every day | ORAL | Status: DC
  Administered 2016-04-12 – 2016-04-13 (×2): 1 via ORAL
  Filled 2016-04-12: qty 1

## 2016-04-12 MED ORDER — OXYCODONE HCL 5 MG PO TABS: 10.0000 mg | ORAL_TABLET | ORAL | Status: DC | PRN

## 2016-04-12 MED ORDER — OXYTOCIN 40 UNITS IN LACTATED RINGERS INFUSION - SIMPLE MED: 2.5000 [IU]/h | INTRAVENOUS | Status: AC

## 2016-04-12 MED ORDER — IBUPROFEN 800 MG PO TABS
800.0000 mg | ORAL_TABLET | Freq: Three times a day (TID) | ORAL | Status: DC
  Administered 2016-04-12 – 2016-04-13 (×5): 800 mg via ORAL
  Filled 2016-04-12 (×5): qty 1

## 2016-04-12 MED ORDER — SIMETHICONE 80 MG PO CHEW: 80.0000 mg | CHEWABLE_TABLET | ORAL | Status: DC | PRN

## 2016-04-12 MED ORDER — SIMETHICONE 80 MG PO CHEW
80.0000 mg | CHEWABLE_TABLET | Freq: Three times a day (TID) | ORAL | Status: DC
  Administered 2016-04-12 – 2016-04-13 (×4): 80 mg via ORAL
  Filled 2016-04-12 (×4): qty 1

## 2016-04-12 MED ORDER — ACETAMINOPHEN 325 MG PO TABS
650.0000 mg | ORAL_TABLET | ORAL | Status: DC | PRN
  Administered 2016-04-12: 650 mg via ORAL
  Filled 2016-04-12: qty 2

## 2016-04-12 MED ORDER — LACTATED RINGERS IV SOLN: INTRAVENOUS | Status: DC

## 2016-04-12 MED ORDER — COCONUT OIL OIL
1.0000 "application " | TOPICAL_OIL | Status: DC | PRN
  Administered 2016-04-12: 1 via TOPICAL
  Filled 2016-04-12: qty 120

## 2016-04-12 MED ORDER — SIMETHICONE 80 MG PO CHEW
80.0000 mg | CHEWABLE_TABLET | ORAL | Status: DC
  Administered 2016-04-12 – 2016-04-13 (×2): 80 mg via ORAL
  Filled 2016-04-12 (×2): qty 1

## 2016-04-12 MED ORDER — DIPHENHYDRAMINE HCL 25 MG PO CAPS: 25.0000 mg | ORAL_CAPSULE | Freq: Four times a day (QID) | ORAL | Status: DC | PRN

## 2016-04-12 NOTE — Progress Notes (Addendum)
Post Partum Day 1 Subjective: no complaints, up ad lib, voiding, tolerating PO and nl lochia, pain controlled  Objective: Blood pressure 136/80, pulse 84, temperature 97.9 F (36.6 C), resp. rate 20, height 6' (1.829 m), weight 94.3 kg (208 lb), last menstrual period 07/07/2015, SpO2 97 %, unknown if currently breastfeeding.  Physical Exam:  General: alert and no distress Lochia: appropriate Uterine Fundus: firm   Recent Labs  04/11/16 0805 04/12/16 0517  HGB 11.0* 9.6*  HCT 32.6* 28.5*    Assessment/Plan: Discharge home, Breastfeeding and Lactation consult.  Routine care - d/c with motrin and PNV.  F/u 6 weeks  Pt changed mind re discharge, baby having jaundice issues.  rouine d/c tomorrow   LOS: 1 day   [name]-Stuckert, Lissandra Keil 04/12/2016, 8:35 AM

## 2016-04-12 NOTE — Lactation Note (Signed)
This note was copied from a baby's chart. Lactation Consultation Note  Baby 18 hours old.  Ex BF 1 year. Baby DAT+. Mother has semi flat nipples. Baby has recessed chin and is tongue thrusting. Mother easily expressed drops of colostrum.  Mother states last night baby sustained latch longer. Provided mother w/ manual pump to prepump to help evert nipples. Mother attempted in cross cradle. Repositioned baby to football hold. With compression mother was able to latch baby. A few swallows observed but lots of off and on. Nipple was slightly compressed after latch. Encouraged depth. Recommend mother hand express into spoon before or after feedings and give to baby to increase volume. Mom encouraged to feed baby 8-12 times/24 hours and with feeding cues.  Mom made aware of O/P services, breastfeeding support groups, community resources, and our phone # for post-discharge questions.     Patient Name: Girl Dorris CarnesHolley Mearns [id number]'s Date: 04/12/2016 Reason for consult: Initial assessment   Maternal Data    Feeding Feeding Type: Breast Fed Length of feed: 10 min  LATCH Score/Interventions Latch: Repeated attempts needed to sustain latch, nipple held in mouth throughout feeding, stimulation needed to elicit sucking reflex.  Audible Swallowing: Spontaneous and intermittent  Type of Nipple: Everted at rest and after stimulation (semi flat)  Comfort (Breast/Nipple): Soft / non-tender     Hold (Positioning): Assistance needed to correctly position infant at breast and maintain latch.  LATCH Score: 8  Lactation Tools Discussed/Used     Consult Status Consult Status: Follow-up Date: 04/13/16 Follow-up type: In-patient    Dahlia ByesBerkelhammer, Michael Walrath [facility] 04/12/2016, 2:51 PM

## 2016-04-12 NOTE — Anesthesia Postprocedure Evaluation (Signed)
Anesthesia Post Note  Patient: Lisa [name]  Procedure(s) Performed: * No procedures listed *  Patient location during evaluation: Mother Baby Anesthesia Type: Epidural Level of consciousness: awake Pain management: satisfactory to patient Vital Signs Assessment: post-procedure vital signs reviewed and stable Respiratory status: spontaneous breathing Cardiovascular status: stable Anesthetic complications: no     Last Vitals:  Vitals:   04/12/16 0408 04/12/16 0800  BP: 121/70 136/80  Pulse: 74 84  Resp: 18 20  Temp: 36.6 C 36.6 C    Last Pain:  Vitals:   04/12/16 0800  TempSrc:   PainSc: 4    Pain Goal:                 KeyCorpBURGER,Jamus Loving

## 2016-04-13 ENCOUNTER — Ambulatory Visit: Payer: Self-pay

## 2016-04-13 MED ORDER — IBUPROFEN 800 MG PO TABS: 800.0000 mg | ORAL_TABLET | Freq: Three times a day (TID) | ORAL | 0 refills | Status: DC

## 2016-04-13 NOTE — Discharge Instructions (Signed)
As per discharge pamphlet °

## 2016-04-13 NOTE — Discharge Summary (Signed)
OB Discharge Summary     Patient Name: Oren BeckmannHolley L Nicklin DOB: [birth year] MRN: [id number]  Date of admission: 04/11/2016 Delivering MD: Sherian ReinBOVARD-STUCKERT, JODY   Date of discharge: 04/13/2016  Admitting diagnosis: 39 induction Intrauterine pregnancy: 547w6d     Secondary diagnosis:  Principal Problem:   SVD (spontaneous vaginal delivery) Active Problems:   Normal pregnancy in multigravida in third trimester      Discharge diagnosis: Term Pregnancy Delivered                                    Hospital course:  Induction of Labor With Vaginal Delivery   30 y.o. yo [id number] at 4347w6d was admitted to the hospital 04/11/2016 for induction of labor.  Indication for induction: Favorable cervix at term.  Patient had an uncomplicated labor course as follows: Membrane Rupture Time/Date: 1:32 PM ,04/11/2016   Intrapartum Procedures: Episiotomy: None [1]                                         Lacerations:  1st degree [2];Perineal [11]  Patient had delivery of a Viable infant.  Information for the patient's newborn:  Clance BollDeweese, Girl Pete GlatterHolley [[id number]][[id number]]  Delivery Method: Vaginal, Spontaneous Delivery (Filed from Delivery Summary)   04/11/2016  Details of delivery can be found in separate delivery note.  Patient had a routine postpartum course. Patient is discharged home 04/13/16.   Physical exam Vitals:   04/12/16 0408 04/12/16 0800 04/12/16 1832 04/13/16 0600  BP: 121/70 136/80 127/83 128/79  Pulse: 74 84 84 75  Resp: 18 20 20 18   Temp: 97.9 F (36.6 C) 97.9 F (36.6 C) 98.1 F (36.7 C) 97.5 F (36.4 C)  TempSrc: Oral  Oral   SpO2: 97%     Weight:      Height:       General: alert Lochia: appropriate Uterine Fundus: firm  Labs: Lab Results  Component Value Date   WBC 12.1 (H) 04/12/2016   HGB 9.6 (L) 04/12/2016   HCT 28.5 (L) 04/12/2016   MCV 87.2 04/12/2016   PLT 171 04/12/2016   CMP Latest Ref Rng & Units 11/18/2015  Glucose 65 - 99 mg/dL 130(Q146(H)  BUN 6 - 20 mg/dL <6(V<5(L)   Creatinine 7.840.44 - 1.00 mg/dL 6.960.51  Sodium 295135 - 284145 mmol/L 137  Potassium 3.5 - 5.1 mmol/L 3.3(L)  Chloride 101 - 111 mmol/L 106  CO2 22 - 32 mmol/L 21(L)  Calcium 8.9 - 10.3 mg/dL 8.9  Total Protein 6.5 - 8.1 g/dL 6.0(L)  Total Bilirubin 0.3 - 1.2 mg/dL 0.7  Alkaline Phos 38 - 126 U/L 97  AST 15 - 41 U/L 95(H)  ALT 14 - 54 U/L 139(H)    Discharge instruction: per After Visit Summary and "Baby and Me Booklet".  After visit meds:    Medication List    TAKE these medications   famotidine 20 MG tablet Commonly known as:  PEPCID Take 20 mg by mouth 2 (two) times daily.   ibuprofen 800 MG tablet Commonly known as:  ADVIL,MOTRIN Take 1 tablet (800 mg total) by mouth every 8 (eight) hours.   PRENATE MINI 18-0.6-0.4-350 MG Caps Take 1 capsule by mouth daily.       Diet: routine diet  Activity: Advance as tolerated. Pelvic rest for 6 weeks.  Outpatient follow up:4 weeks   Newborn Data: Live born female  Birth Weight: 9 lb 4 oz (4195 g) APGAR: 9, 9  Baby Feeding: Breast Disposition:home with mother   04/13/2016 Zenaida NieceMEISINGER,Ivana Nicastro D, MD

## 2016-04-13 NOTE — Lactation Note (Signed)
This note was copied from a baby's chart. Lactation Consultation Note  Patient Name: Lisa Dorris CarnesHolley Dismore WUJWJ'XToday's Date: 04/13/2016 Reason for consult: Follow-up assessment Baby at 50 hr of life. Mom reports that her milk is coming in and baby is cluster feeding. She denies breast or nipple soreness since she has stopped wearing the shells. Baby latched easily and comfortably at this visit. 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 If she gets any expressed milk she is feeding it back to baby with a spoon. Answered questions about pumping for return to work. Reminded mom of support group and OP services.   Maternal Data Has patient been taught Hand Expression?: Yes  Feeding Feeding Type: Breast Fed Length of feed: 10 min  LATCH Score/Interventions Latch: Grasps breast easily, tongue down, lips flanged, rhythmical sucking. Intervention(s): Breast compression  Audible Swallowing: Spontaneous and intermittent Intervention(s): Skin to skin;Hand expression  Type of Nipple: Everted at rest and after stimulation  Comfort (Breast/Nipple): Soft / non-tender  Problem noted: Filling Interventions (Filling): Frequent nursing  Hold (Positioning): No assistance needed to correctly position infant at breast. Intervention(s): Position options  LATCH Score: 10  Lactation Tools Discussed/Used     Consult Status Consult Status: Follow-up Date: 04/14/16 Follow-up type: In-patient    Rulon Eisenmengerlizabeth E Vantasia Pinkney 04/13/2016, 10:59 PM

## 2016-04-13 NOTE — Progress Notes (Signed)
PPD #2 Doing well, baby may need bili lights Afeb, VSS D/c home

## 2016-04-14 ENCOUNTER — Ambulatory Visit: Payer: Self-pay

## 2016-04-14 NOTE — Lactation Note (Signed)
This note was copied from a baby's chart. Lactation Consultation Note  Patient Name: Lisa Dorris CarnesHolley Delo IONGE'XToday's Date: 04/14/2016 Reason for consult: Follow-up assessment;Hyperbilirubinemia;Other (Comment) (single photo , 4% weight loss , milk in ) Baby is 61 hours old and has been to the breast consistently , average feeding times 15 -30 mins , up to 9 scale scores  Per mom breast are fuller and milk is coming in and hearing for swallows, cluster feeding, and recently fed. LC updated doc  Flow sheets. LC reviewed effects of jaundice on BF and how it can make a baby sluggish and stressed the importance of  Watching for hanging out ( nutritive vs non - nutritive feeding patterns). Sore nipple and engorgement prevention and tx . Mom  Did not voice any concerns of sore nipples or engorgement presently and feels BF is going well.  Per RN caring for baby and parents , baby is going home on single photo tx this afternoon.  LC encouraged mom to call with feeding cues.  Mother informed of post-discharge support and given phone number to the lactation department, including services for phone call assistance; out-patient appointments; and breastfeeding support group. List of other breastfeeding resources in the community given in the handout. Encouraged mother to call for problems or concerns related to breastfeeding.  Maternal Data Does the patient have breastfeeding experience prior to this delivery?: Yes  Feeding Feeding Type:  (baby recently breast fed ) Length of feed: 30 min (per mom )  LATCH Score/Interventions                Intervention(s): Breastfeeding basics reviewed     Lactation Tools Discussed/Used WIC Program: No Pump Review: Setup, frequency, and cleaning (mom reminded mom to make sure she takes yellow piece and white membrane off to clean with bottle brush and dish soap )   Consult Status Consult Status: Complete Date: 04/14/16    Matilde SprangMargaret Ann Lyah Millirons 04/14/2016,  10:15 AM

## 2016-05-27 DIAGNOSIS — Z3009 Encounter for other general counseling and advice on contraception: Secondary | ICD-10-CM | POA: Diagnosis not present

## 2016-05-27 DIAGNOSIS — Z1389 Encounter for screening for other disorder: Secondary | ICD-10-CM | POA: Diagnosis not present
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
## 2017-02-23 IMAGING — CR DG CHEST 2V
2 series · 2 of 2 positions shown · non-contrast
Comparison: No prior.

CLINICAL DATA: Pneumothorax.

EXAM:
CHEST  2 VIEW

[w chest pa]
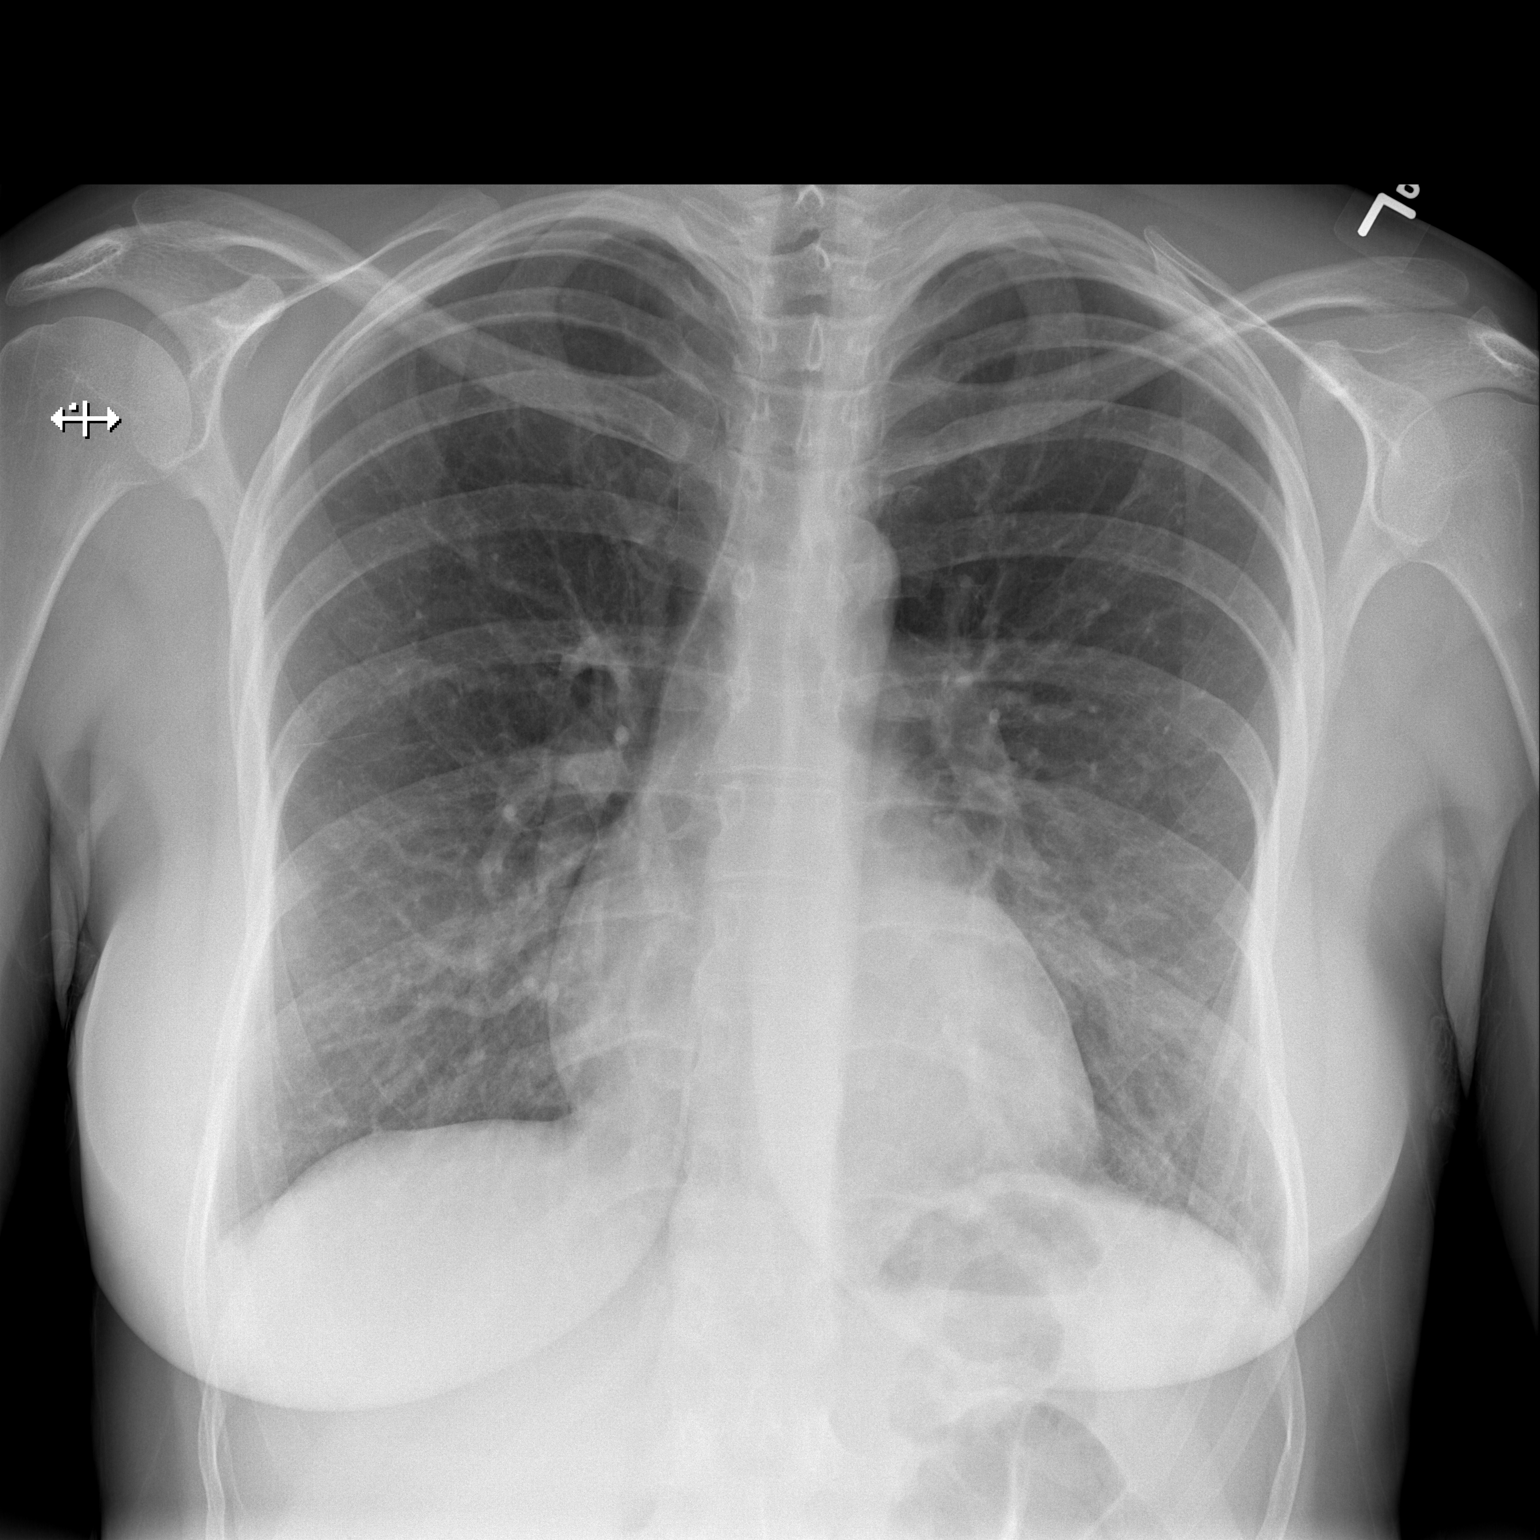

[w chest lat]
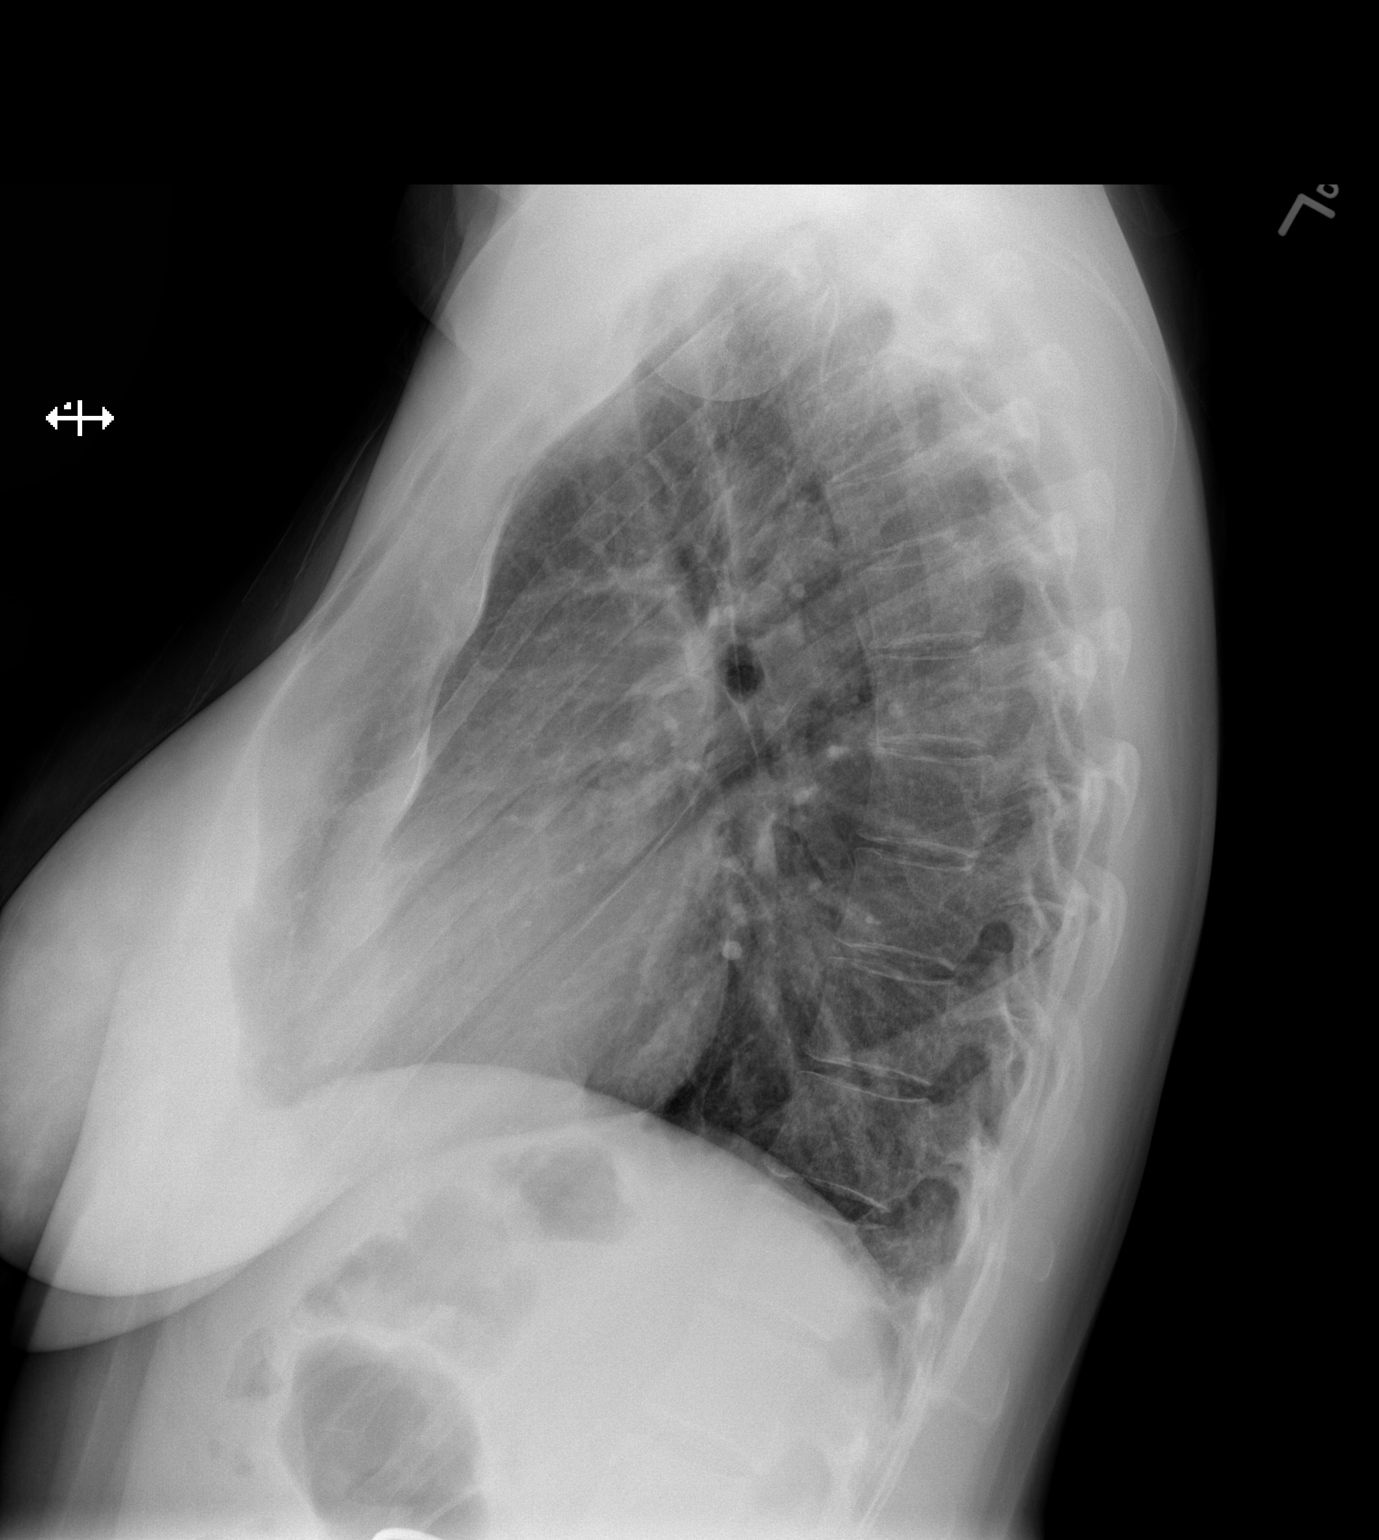

[2 of 2 positions shown; findings below may reference images not displayed]

FINDINGS: Mediastinum hilar structures normal. Mild left lower lobe
subsegmental atelectasis and or infiltrate. Tiny left pleural
effusion cannot be excluded. No pneumothorax. Heart size normal .
Pectus deformity. No acute bony abnormality.
IMPRESSION: Mild left lower lobe subsegmental atelectasis and or infiltrate.
Tiny left pleural effusion cannot be excluded .

## 2017-08-10 DIAGNOSIS — Z124 Encounter for screening for malignant neoplasm of cervix: Secondary | ICD-10-CM | POA: Diagnosis not present

## 2017-08-10 DIAGNOSIS — Z6826 Body mass index (BMI) 26.0-26.9, adult: Secondary | ICD-10-CM | POA: Diagnosis not present

## 2017-08-10 DIAGNOSIS — Z3041 Encounter for surveillance of contraceptive pills: Secondary | ICD-10-CM | POA: Diagnosis not present

## 2017-08-10 DIAGNOSIS — Z1389 Encounter for screening for other disorder: Secondary | ICD-10-CM | POA: Diagnosis not present

## 2017-08-10 DIAGNOSIS — Z01419 Encounter for gynecological examination (general) (routine) without abnormal findings: Secondary | ICD-10-CM | POA: Diagnosis not present

## 2017-08-10 DIAGNOSIS — Z130 Encounter for screening for diseases of the blood and blood-forming organs and certain disorders involving the immune mechanism: Secondary | ICD-10-CM | POA: Diagnosis not present

## 2017-08-11 DIAGNOSIS — Z124 Encounter for screening for malignant neoplasm of cervix: Secondary | ICD-10-CM | POA: Diagnosis not present

## 2017-08-11 LAB — HM PAP SMEAR: HM Pap smear: NEGATIVE

## 2017-09-02 ENCOUNTER — Ambulatory Visit: Payer: Self-pay | Admitting: Physician Assistant

## 2017-09-20 ENCOUNTER — Encounter: Payer: Self-pay | Admitting: Physician Assistant

## 2017-09-20 ENCOUNTER — Ambulatory Visit: Payer: 59 | Admitting: Physician Assistant

## 2017-09-20 VITALS — BP 118/78 | HR 94 | Temp 97.9°F | Ht 72.0 in | Wt 192.6 lb

## 2017-09-20 DIAGNOSIS — Z0000 Encounter for general adult medical examination without abnormal findings: Secondary | ICD-10-CM

## 2017-09-20 DIAGNOSIS — Z136 Encounter for screening for cardiovascular disorders: Secondary | ICD-10-CM

## 2017-09-20 DIAGNOSIS — D649 Anemia, unspecified: Secondary | ICD-10-CM

## 2017-09-20 DIAGNOSIS — Z13220 Encounter for screening for lipoid disorders: Secondary | ICD-10-CM | POA: Diagnosis not present

## 2017-09-20 LAB — COMPREHENSIVE METABOLIC PANEL
ALK PHOS: 64 U/L (ref 39–117)
ALT: 16 U/L (ref 0–35)
AST: 16 U/L (ref 0–37)
Albumin: 4.6 g/dL (ref 3.5–5.2)
BILIRUBIN TOTAL: 0.6 mg/dL (ref 0.2–1.2)
BUN: 9 mg/dL (ref 6–23)
CALCIUM: 9.5 mg/dL (ref 8.4–10.5)
CO2: 27 meq/L (ref 19–32)
Chloride: 105 mEq/L (ref 96–112)
Creatinine, Ser: 0.7 mg/dL (ref 0.40–1.20)
GFR: 102.96 mL/min (ref >60.00)
Glucose, Bld: 75 mg/dL (ref 70–99)
Potassium: 4.5 mEq/L (ref 3.5–5.1)
Sodium: 137 mEq/L (ref 135–145)
TOTAL PROTEIN: 7.3 g/dL (ref 6.0–8.3)

## 2017-09-20 LAB — CBC WITH DIFFERENTIAL/PLATELET
BASOS ABS: 0 10*3/uL (ref 0.0–0.1)
Basophils Relative: 0.4 % (ref 0.0–3.0)
EOS PCT: 1.1 % (ref 0.0–5.0)
Eosinophils Absolute: 0.1 10*3/uL (ref 0.0–0.7)
HEMATOCRIT: 42.2 % (ref 36.0–46.0)
Hemoglobin: 14.2 g/dL (ref 12.0–15.0)
LYMPHS PCT: 39.6 % (ref 12.0–46.0)
Lymphs Abs: 2.6 10*3/uL (ref 0.7–4.0)
MCHC: 33.7 g/dL (ref 30.0–36.0)
MCV: 87.8 fl (ref 78.0–100.0)
MONOS PCT: 6.1 % (ref 3.0–12.0)
Monocytes Absolute: 0.4 10*3/uL (ref 0.1–1.0)
NEUTROS ABS: 3.5 10*3/uL (ref 1.4–7.7)
Neutrophils Relative %: 52.8 % (ref 43.0–77.0)
PLATELETS: 300 10*3/uL (ref 150.0–400.0)
RBC: 4.81 Mil/uL (ref 3.87–5.11)
RDW: 13.1 % (ref 11.5–15.5)
WBC: 6.6 10*3/uL (ref 4.0–10.5)

## 2017-09-20 LAB — LIPID PANEL
Cholesterol: 162 mg/dL (ref 0–200)
HDL: 31.2 mg/dL — AB (ref >39.00)
NonHDL: 131.18
TRIGLYCERIDES: 205 mg/dL — AB (ref 0.0–149.0)
Total CHOL/HDL Ratio: 5
VLDL: 41 mg/dL — ABNORMAL HIGH (ref 0.0–40.0)

## 2017-09-20 LAB — LDL CHOLESTEROL, DIRECT: Direct LDL: 115 mg/dL

## 2017-09-20 NOTE — Patient Instructions (Signed)
It was great to meet you!  Health Maintenance, Female Adopting a healthy lifestyle and getting preventive care can go a long way to promote health and wellness. Talk with your health care provider about what schedule of regular examinations is right for you. This is a good chance for you to check in with your provider about disease prevention and staying healthy. In between checkups, there are plenty of things you can do on your own. Experts have done a lot of research about which lifestyle changes and preventive measures are most likely to keep you healthy. Ask your health care provider for more information. Weight and diet Eat a healthy diet  Be sure to include plenty of vegetables, fruits, low-fat dairy products, and lean protein.  Do not eat a lot of foods high in solid fats, added sugars, or salt.  Get regular exercise. This is one of the most important things you can do for your health. ? Most adults should exercise for at least 150 minutes each week. The exercise should increase your heart rate and make you sweat (moderate-intensity exercise). ? Most adults should also do strengthening exercises at least twice a week. This is in addition to the moderate-intensity exercise.  Maintain a healthy weight  Body mass index (BMI) is a measurement that can be used to identify possible weight problems. It estimates body fat based on height and weight. Your health care provider can help determine your BMI and help you achieve or maintain a healthy weight.  For females 20 years of age and older: ? A BMI below 18.5 is considered underweight. ? A BMI of 18.5 to 24.9 is normal. ? A BMI of 25 to 29.9 is considered overweight. ? A BMI of 30 and above is considered obese.  Watch levels of cholesterol and blood lipids  You should start having your blood tested for lipids and cholesterol at 32 years of age, then have this test every 5 years.  You may need to have your cholesterol levels checked more  often if: ? Your lipid or cholesterol levels are high. ? You are older than 32 years of age. ? You are at high risk for heart disease.  Cancer screening Lung Cancer  Lung cancer screening is recommended for adults 55-80 years old who are at high risk for lung cancer because of a history of smoking.  A yearly low-dose CT scan of the lungs is recommended for people who: ? Currently smoke. ? Have quit within the past 15 years. ? Have at least a 30-pack-year history of smoking. A pack year is smoking an average of one pack of cigarettes a day for 1 year.  Yearly screening should continue until it has been 15 years since you quit.  Yearly screening should stop if you develop a health problem that would prevent you from having lung cancer treatment.  Breast Cancer  Practice breast self-awareness. This means understanding how your breasts normally appear and feel.  It also means doing regular breast self-exams. Let your health care provider know about any changes, no matter how small.  If you are in your 20s or 30s, you should have a clinical breast exam (CBE) by a health care provider every 1-3 years as part of a regular health exam.  If you are 40 or older, have a CBE every year. Also consider having a breast X-ray (mammogram) every year.  If you have a family history of breast cancer, talk to your health care provider about genetic screening.    If you are at high risk for breast cancer, talk to your health care provider about having an MRI and a mammogram every year.  Breast cancer gene (BRCA) assessment is recommended for women who have family members with BRCA-related cancers. BRCA-related cancers include: ? Breast. ? Ovarian. ? Tubal. ? Peritoneal cancers.  Results of the assessment will determine the need for genetic counseling and BRCA1 and BRCA2 testing.  Cervical Cancer Your health care provider may recommend that you be screened regularly for cancer of the pelvic organs  (ovaries, uterus, and vagina). This screening involves a pelvic examination, including checking for microscopic changes to the surface of your cervix (Pap test). You may be encouraged to have this screening done every 3 years, beginning at age 27.  For women ages 19-65, health care providers may recommend pelvic exams and Pap testing every 3 years, or they may recommend the Pap and pelvic exam, combined with testing for human papilloma virus (HPV), every 5 years. Some types of HPV increase your risk of cervical cancer. Testing for HPV may also be done on women of any age with unclear Pap test results.  Other health care providers may not recommend any screening for nonpregnant women who are considered low risk for pelvic cancer and who do not have symptoms. Ask your health care provider if a screening pelvic exam is right for you.  If you have had past treatment for cervical cancer or a condition that could lead to cancer, you need Pap tests and screening for cancer for at least 20 years after your treatment. If Pap tests have been discontinued, your risk factors (such as having a new sexual partner) need to be reassessed to determine if screening should resume. Some women have medical problems that increase the chance of getting cervical cancer. In these cases, your health care provider may recommend more frequent screening and Pap tests.  Colorectal Cancer  This type of cancer can be detected and often prevented.  Routine colorectal cancer screening usually begins at 32 years of age and continues through 32 years of age.  Your health care provider may recommend screening at an earlier age if you have risk factors for colon cancer.  Your health care provider may also recommend using home test kits to check for hidden blood in the stool.  A small camera at the end of a tube can be used to examine your colon directly (sigmoidoscopy or colonoscopy). This is done to check for the earliest forms of  colorectal cancer.  Routine screening usually begins at age 23.  Direct examination of the colon should be repeated every 5-10 years through 32 years of age. However, you may need to be screened more often if early forms of precancerous polyps or small growths are found.  Skin Cancer  Check your skin from head to toe regularly.  Tell your health care provider about any new moles or changes in moles, especially if there is a change in a mole's shape or color.  Also tell your health care provider if you have a mole that is larger than the size of a pencil eraser.  Always use sunscreen. Apply sunscreen liberally and repeatedly throughout the day.  Protect yourself by wearing long sleeves, pants, a wide-brimmed hat, and sunglasses whenever you are outside.  Heart disease, diabetes, and high blood pressure  High blood pressure causes heart disease and increases the risk of stroke. High blood pressure is more likely to develop in: ? People who have blood  pressure in the high end of the normal range (130-139/85-89 mm Hg). ? People who are overweight or obese. ? People who are African American.  If you are 65-57 years of age, have your blood pressure checked every 3-5 years. If you are 45 years of age or older, have your blood pressure checked every year. You should have your blood pressure measured twice-once when you are at a hospital or clinic, and once when you are not at a hospital or clinic. Record the average of the two measurements. To check your blood pressure when you are not at a hospital or clinic, you can use: ? An automated blood pressure machine at a pharmacy. ? A home blood pressure monitor.  If you are between 10 years and 29 years old, ask your health care provider if you should take aspirin to prevent strokes.  Have regular diabetes screenings. This involves taking a blood sample to check your fasting blood sugar level. ? If you are at a normal weight and have a low risk  for diabetes, have this test once every three years after 32 years of age. ? If you are overweight and have a high risk for diabetes, consider being tested at a younger age or more often. Preventing infection Hepatitis B  If you have a higher risk for hepatitis B, you should be screened for this virus. You are considered at high risk for hepatitis B if: ? You were born in a country where hepatitis B is common. Ask your health care provider which countries are considered high risk. ? Your parents were born in a high-risk country, and you have not been immunized against hepatitis B (hepatitis B vaccine). ? You have HIV or AIDS. ? You use needles to inject street drugs. ? You live with someone who has hepatitis B. ? You have had sex with someone who has hepatitis B. ? You get hemodialysis treatment. ? You take certain medicines for conditions, including cancer, organ transplantation, and autoimmune conditions.  Hepatitis C  Blood testing is recommended for: ? Everyone born from [birth year] through 1965. ? Anyone with known risk factors for hepatitis C.  Sexually transmitted infections (STIs)  You should be screened for sexually transmitted infections (STIs) including gonorrhea and chlamydia if: ? You are sexually active and are younger than 32 years of age. ? You are older than 32 years of age and your health care provider tells you that you are at risk for this type of infection. ? Your sexual activity has changed since you were last screened and you are at an increased risk for chlamydia or gonorrhea. Ask your health care provider if you are at risk.  If you do not have HIV, but are at risk, it may be recommended that you take a prescription medicine daily to prevent HIV infection. This is called pre-exposure prophylaxis (PrEP). You are considered at risk if: ? You are sexually active and do not regularly use condoms or know the HIV status of your partner(s). ? You take drugs by  injection. ? You are sexually active with a partner who has HIV.  Talk with your health care provider about whether you are at high risk of being infected with HIV. If you choose to begin PrEP, you should first be tested for HIV. You should then be tested every 3 months for as long as you are taking PrEP. Pregnancy  If you are premenopausal and you may become pregnant, ask your health care provider about preconception  counseling.  If you may become pregnant, take 400 to 800 micrograms (mcg) of folic acid every day.  If you want to prevent pregnancy, talk to your health care provider about birth control (contraception). Osteoporosis and menopause  Osteoporosis is a disease in which the bones lose minerals and strength with aging. This can result in serious bone fractures. Your risk for osteoporosis can be identified using a bone density scan.  If you are 54 years of age or older, or if you are at risk for osteoporosis and fractures, ask your health care provider if you should be screened.  Ask your health care provider whether you should take a calcium or vitamin D supplement to lower your risk for osteoporosis.  Menopause may have certain physical symptoms and risks.  Hormone replacement therapy may reduce some of these symptoms and risks. Talk to your health care provider about whether hormone replacement therapy is right for you. Follow these instructions at home:  Schedule regular health, dental, and eye exams.  Stay current with your immunizations.  Do not use any tobacco products including cigarettes, chewing tobacco, or electronic cigarettes.  If you are pregnant, do not drink alcohol.  If you are breastfeeding, limit how much and how often you drink alcohol.  Limit alcohol intake to no more than 1 drink per day for nonpregnant women. One drink equals 12 ounces of beer, 5 ounces of wine, or 1 ounces of hard liquor.  Do not use street drugs.  Do not share needles.  Ask  your health care provider for help if you need support or information about quitting drugs.  Tell your health care provider if you often feel depressed.  Tell your health care provider if you have ever been abused or do not feel safe at home. This information is not intended to replace advice given to you by your health care provider. Make sure you discuss any questions you have with your health care provider. Document Released: 12/08/2010 Document Revised: 10/31/2015 Document Reviewed: 02/26/2015 Elsevier Interactive Patient Education  [employer].
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
## 2017-09-22 ENCOUNTER — Other Ambulatory Visit: Payer: Self-pay

## 2017-09-27 ENCOUNTER — Encounter: Payer: Self-pay | Admitting: Physician Assistant

## 2017-12-22 ENCOUNTER — Ambulatory Visit: Payer: Self-pay | Admitting: Nurse Practitioner

## 2018-08-30 ENCOUNTER — Encounter: Payer: Self-pay | Admitting: Physician Assistant

## 2018-09-05 ENCOUNTER — Encounter: Payer: 59 | Admitting: Physician Assistant

## 2018-09-12 ENCOUNTER — Encounter: Payer: Self-pay | Admitting: Physician Assistant

## 2018-11-06 DIAGNOSIS — G500 Trigeminal neuralgia: Secondary | ICD-10-CM | POA: Insufficient documentation
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
## 2018-11-07 ENCOUNTER — Ambulatory Visit (INDEPENDENT_AMBULATORY_CARE_PROVIDER_SITE_OTHER): Payer: 59 | Admitting: Physician Assistant

## 2018-11-07 ENCOUNTER — Encounter: Payer: Self-pay | Admitting: Physician Assistant

## 2018-11-07 ENCOUNTER — Other Ambulatory Visit: Payer: Self-pay

## 2018-11-07 VITALS — BP 122/85 | HR 80 | Temp 98.4°F | Ht 72.0 in | Wt 152.0 lb

## 2018-11-07 DIAGNOSIS — Z0000 Encounter for general adult medical examination without abnormal findings: Secondary | ICD-10-CM | POA: Diagnosis not present

## 2018-11-07 DIAGNOSIS — Z13220 Encounter for screening for lipoid disorders: Secondary | ICD-10-CM

## 2018-11-07 DIAGNOSIS — Z136 Encounter for screening for cardiovascular disorders: Secondary | ICD-10-CM | POA: Diagnosis not present

## 2018-11-07 LAB — CBC WITH DIFFERENTIAL/PLATELET
Basophils Absolute: 0 10*3/uL (ref 0.0–0.1)
Basophils Relative: 0.5 % (ref 0.0–3.0)
Eosinophils Absolute: 0 10*3/uL (ref 0.0–0.7)
Eosinophils Relative: 0.8 % (ref 0.0–5.0)
HCT: 39 % (ref 36.0–46.0)
Hemoglobin: 13.6 g/dL (ref 12.0–15.0)
Lymphocytes Relative: 42.5 % (ref 12.0–46.0)
Lymphs Abs: 2.2 10*3/uL (ref 0.7–4.0)
MCHC: 34.8 g/dL (ref 30.0–36.0)
MCV: 89.3 fl (ref 78.0–100.0)
Monocytes Absolute: 0.4 10*3/uL (ref 0.1–1.0)
Monocytes Relative: 6.9 % (ref 3.0–12.0)
Neutro Abs: 2.5 10*3/uL (ref 1.4–7.7)
Neutrophils Relative %: 49.3 % (ref 43.0–77.0)
Platelets: 210 10*3/uL (ref 150.0–400.0)
RBC: 4.37 Mil/uL (ref 3.87–5.11)
RDW: 12.3 % (ref 11.5–15.5)
WBC: 5.1 10*3/uL (ref 4.0–10.5)

## 2018-11-07 LAB — COMPREHENSIVE METABOLIC PANEL
ALT: 18 U/L (ref 0–35)
AST: 21 U/L (ref 0–37)
Albumin: 4.3 g/dL (ref 3.5–5.2)
Alkaline Phosphatase: 44 U/L (ref 39–117)
BUN: 9 mg/dL (ref 6–23)
CO2: 26 mEq/L (ref 19–32)
Calcium: 9.4 mg/dL (ref 8.4–10.5)
Chloride: 105 mEq/L (ref 96–112)
Creatinine, Ser: 0.75 mg/dL (ref 0.40–1.20)
GFR: 88.83 mL/min (ref >60.00)
Glucose, Bld: 81 mg/dL (ref 70–99)
Potassium: 3.9 mEq/L (ref 3.5–5.1)
Sodium: 138 mEq/L (ref 135–145)
Total Bilirubin: 0.6 mg/dL (ref 0.2–1.2)
Total Protein: 6.9 g/dL (ref 6.0–8.3)

## 2018-11-07 LAB — LIPID PANEL
Cholesterol: 142 mg/dL (ref 0–200)
HDL: 48.8 mg/dL (ref >39.00)
LDL Cholesterol: 80 mg/dL (ref 0–99)
NonHDL: 93
Total CHOL/HDL Ratio: 3
Triglycerides: 63 mg/dL (ref 0.0–149.0)
VLDL: 12.6 mg/dL (ref 0.0–40.0)

## 2018-11-07 NOTE — Patient Instructions (Signed)
It was great to see you!  Please go to the lab for blood work.   Our office will call you with your results unless you have chosen to receive results via MyChart.  If your blood work is normal we will follow-up each year for physicals and as scheduled for chronic medical problems.  If anything is abnormal we will treat accordingly and get you in for a follow-up.  Take care,  [facility] Maintenance, Female Adopting a healthy lifestyle and getting preventive care can go a long way to promote health and wellness. Talk with your health care provider about what schedule of regular examinations is right for you. This is a good chance for you to check in with your provider about disease prevention and staying healthy. In between checkups, there are plenty of things you can do on your own. Experts have done a lot of research about which lifestyle changes and preventive measures are most likely to keep you healthy. Ask your health care provider for more information. Weight and diet Eat a healthy diet  Be sure to include plenty of vegetables, fruits, low-fat dairy products, and lean protein.  Do not eat a lot of foods high in solid fats, added sugars, or salt.  Get regular exercise. This is one of the most important things you can do for your health. ? Most adults should exercise for at least 150 minutes each week. The exercise should increase your heart rate and make you sweat (moderate-intensity exercise). ? Most adults should also do strengthening exercises at least twice a week. This is in addition to the moderate-intensity exercise. Maintain a healthy weight  Body mass index (BMI) is a measurement that can be used to identify possible weight problems. It estimates body fat based on height and weight. Your health care provider can help determine your BMI and help you achieve or maintain a healthy weight.  For females 55 years of age and older: ? A BMI below 18.5 is considered  underweight. ? A BMI of 18.5 to 24.9 is normal. ? A BMI of 25 to 29.9 is considered overweight. ? A BMI of 30 and above is considered obese. Watch levels of cholesterol and blood lipids  You should start having your blood tested for lipids and cholesterol at 33 years of age, then have this test every 5 years.  You may need to have your cholesterol levels checked more often if: ? Your lipid or cholesterol levels are high. ? You are older than 33 years of age. ? You are at high risk for heart disease. Cancer screening Lung Cancer  Lung cancer screening is recommended for adults 60-73 years old who are at high risk for lung cancer because of a history of smoking.  A yearly low-dose CT scan of the lungs is recommended for people who: ? Currently smoke. ? Have quit within the past 15 years. ? Have at least a 30-pack-year history of smoking. A pack year is smoking an average of one pack of cigarettes a day for 1 year.  Yearly screening should continue until it has been 15 years since you quit.  Yearly screening should stop if you develop a health problem that would prevent you from having lung cancer treatment. Breast Cancer  Practice breast self-awareness. This means understanding how your breasts normally appear and feel.  It also means doing regular breast self-exams. Let your health care provider know about any changes, no matter how small.  If you are in  your 20s or 30s, you should have a clinical breast exam (CBE) by a health care provider every 1-3 years as part of a regular health exam.  If you are 40 or older, have a CBE every year. Also consider having a breast X-ray (mammogram) every year.  If you have a family history of breast cancer, talk to your health care provider about genetic screening.  If you are at high risk for breast cancer, talk to your health care provider about having an MRI and a mammogram every year.  Breast cancer gene (BRCA) assessment is recommended  for women who have family members with BRCA-related cancers. BRCA-related cancers include: ? Breast. ? Ovarian. ? Tubal. ? Peritoneal cancers.  Results of the assessment will determine the need for genetic counseling and BRCA1 and BRCA2 testing. Cervical Cancer Your health care provider may recommend that you be screened regularly for cancer of the pelvic organs (ovaries, uterus, and vagina). This screening involves a pelvic examination, including checking for microscopic changes to the surface of your cervix (Pap test). You may be encouraged to have this screening done every 3 years, beginning at age 21.  For women ages 30-65, health care providers may recommend pelvic exams and Pap testing every 3 years, or they may recommend the Pap and pelvic exam, combined with testing for human papilloma virus (HPV), every 5 years. Some types of HPV increase your risk of cervical cancer. Testing for HPV may also be done on women of any age with unclear Pap test results.  Other health care providers may not recommend any screening for nonpregnant women who are considered low risk for pelvic cancer and who do not have symptoms. Ask your health care provider if a screening pelvic exam is right for you.  If you have had past treatment for cervical cancer or a condition that could lead to cancer, you need Pap tests and screening for cancer for at least 20 years after your treatment. If Pap tests have been discontinued, your risk factors (such as having a new sexual partner) need to be reassessed to determine if screening should resume. Some women have medical problems that increase the chance of getting cervical cancer. In these cases, your health care provider may recommend more frequent screening and Pap tests. Colorectal Cancer  This type of cancer can be detected and often prevented.  Routine colorectal cancer screening usually begins at 33 years of age and continues through 33 years of age.  Your health  care provider may recommend screening at an earlier age if you have risk factors for colon cancer.  Your health care provider may also recommend using home test kits to check for hidden blood in the stool.  A small camera at the end of a tube can be used to examine your colon directly (sigmoidoscopy or colonoscopy). This is done to check for the earliest forms of colorectal cancer.  Routine screening usually begins at age 50.  Direct examination of the colon should be repeated every 5-10 years through 33 years of age. However, you may need to be screened more often if early forms of precancerous polyps or small growths are found. Skin Cancer  Check your skin from head to toe regularly.  Tell your health care provider about any new moles or changes in moles, especially if there is a change in a mole's shape or color.  Also tell your health care provider if you have a mole that is larger than the size of a pencil   eraser.  Always use sunscreen. Apply sunscreen liberally and repeatedly throughout the day.  Protect yourself by wearing long sleeves, pants, a wide-brimmed hat, and sunglasses whenever you are outside. Heart disease, diabetes, and high blood pressure  High blood pressure causes heart disease and increases the risk of stroke. High blood pressure is more likely to develop in: ? People who have blood pressure in the high end of the normal range (130-139/85-89 mm Hg). ? People who are overweight or obese. ? People who are African American.  If you are 68-37 years of age, have your blood pressure checked every 3-5 years. If you are 48 years of age or older, have your blood pressure checked every year. You should have your blood pressure measured twice-once when you are at a hospital or clinic, and once when you are not at a hospital or clinic. Record the average of the two measurements. To check your blood pressure when you are not at a hospital or clinic, you can use: ? An automated  blood pressure machine at a pharmacy. ? A home blood pressure monitor.  If you are between 92 years and 57 years old, ask your health care provider if you should take aspirin to prevent strokes.  Have regular diabetes screenings. This involves taking a blood sample to check your fasting blood sugar level. ? If you are at a normal weight and have a low risk for diabetes, have this test once every three years after 33 years of age. ? If you are overweight and have a high risk for diabetes, consider being tested at a younger age or more often. Preventing infection Hepatitis B  If you have a higher risk for hepatitis B, you should be screened for this virus. You are considered at high risk for hepatitis B if: ? You were born in a country where hepatitis B is common. Ask your health care provider which countries are considered high risk. ? Your parents were born in a high-risk country, and you have not been immunized against hepatitis B (hepatitis B vaccine). ? You have HIV or AIDS. ? You use needles to inject street drugs. ? You live with someone who has hepatitis B. ? You have had sex with someone who has hepatitis B. ? You get hemodialysis treatment. ? You take certain medicines for conditions, including cancer, organ transplantation, and autoimmune conditions. Hepatitis C  Blood testing is recommended for: ? Everyone born from [birth year] through 1965. ? Anyone with known risk factors for hepatitis C. Sexually transmitted infections (STIs)  You should be screened for sexually transmitted infections (STIs) including gonorrhea and chlamydia if: ? You are sexually active and are younger than 33 years of age. ? You are older than 33 years of age and your health care provider tells you that you are at risk for this type of infection. ? Your sexual activity has changed since you were last screened and you are at an increased risk for chlamydia or gonorrhea. Ask your health care provider if you are at  risk.  If you do not have HIV, but are at risk, it may be recommended that you take a prescription medicine daily to prevent HIV infection. This is called pre-exposure prophylaxis (PrEP). You are considered at risk if: ? You are sexually active and do not regularly use condoms or know the HIV status of your partner(s). ? You take drugs by injection. ? You are sexually active with a partner who has HIV. Talk with your health  care provider about whether you are at high risk of being infected with HIV. If you choose to begin PrEP, you should first be tested for HIV. You should then be tested every 3 months for as long as you are taking PrEP. Pregnancy  If you are premenopausal and you may become pregnant, ask your health care provider about preconception counseling.  If you may become pregnant, take 400 to 800 micrograms (mcg) of folic acid every day.  If you want to prevent pregnancy, talk to your health care provider about birth control (contraception). Osteoporosis and menopause  Osteoporosis is a disease in which the bones lose minerals and strength with aging. This can result in serious bone fractures. Your risk for osteoporosis can be identified using a bone density scan.  If you are 65 years of age or older, or if you are at risk for osteoporosis and fractures, ask your health care provider if you should be screened.  Ask your health care provider whether you should take a calcium or vitamin D supplement to lower your risk for osteoporosis.  Menopause may have certain physical symptoms and risks.  Hormone replacement therapy may reduce some of these symptoms and risks. Talk to your health care provider about whether hormone replacement therapy is right for you. Follow these instructions at home:  Schedule regular health, dental, and eye exams.  Stay current with your immunizations.  Do not use any tobacco products including cigarettes, chewing tobacco, or electronic  cigarettes.  If you are pregnant, do not drink alcohol.  If you are breastfeeding, limit how much and how often you drink alcohol.  Limit alcohol intake to no more than 1 drink per day for nonpregnant women. One drink equals 12 ounces of beer, 5 ounces of wine, or 1 ounces of hard liquor.  Do not use street drugs.  Do not share needles.  Ask your health care provider for help if you need support or information about quitting drugs.  Tell your health care provider if you often feel depressed.  Tell your health care provider if you have ever been abused or do not feel safe at home. This information is not intended to replace advice given to you by your health care provider. Make sure you discuss any questions you have with your health care provider. Document Released: 12/08/2010 Document Revised: 10/31/2015 Document Reviewed: 02/26/2015 Elsevier Interactive Patient Education  2019 Elsevier Inc.  

## 2019-06-05 ENCOUNTER — Encounter: Payer: Self-pay | Admitting: Physician Assistant

## 2019-06-07 ENCOUNTER — Encounter: Payer: Self-pay | Admitting: Physician Assistant

## 2019-06-07 ENCOUNTER — Ambulatory Visit (INDEPENDENT_AMBULATORY_CARE_PROVIDER_SITE_OTHER): Payer: 59 | Admitting: Physician Assistant

## 2019-06-07 VITALS — Ht 72.0 in | Wt 153.0 lb

## 2019-06-07 DIAGNOSIS — T2220XA Burn of second degree of shoulder and upper limb, except wrist and hand, unspecified site, initial encounter: Secondary | ICD-10-CM

## 2019-06-07 MED ORDER — SILVER SULFADIAZINE 1 % EX CREA: TOPICAL_CREAM | CUTANEOUS | 0 refills | Status: DC
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
## 2020-12-12 ENCOUNTER — Telehealth: Payer: 59 | Admitting: Physician Assistant

## 2020-12-12 DIAGNOSIS — J019 Acute sinusitis, unspecified: Secondary | ICD-10-CM | POA: Diagnosis not present

## 2020-12-12 DIAGNOSIS — B9689 Other specified bacterial agents as the cause of diseases classified elsewhere: Secondary | ICD-10-CM

## 2020-12-12 MED ORDER — AMOXICILLIN-POT CLAVULANATE 875-125 MG PO TABS: 1.0000 | ORAL_TABLET | Freq: Two times a day (BID) | ORAL | 0 refills | Status: DC

## 2020-12-12 NOTE — Progress Notes (Signed)

## 2020-12-12 NOTE — Progress Notes (Signed)
I have spent 5 minutes in review of e-visit questionnaire, review and updating patient chart, medical decision making and response to patient.   Alvester Eads Cody Rechy Bost, PA-C    

## 2020-12-24 ENCOUNTER — Telehealth: Payer: 59 | Admitting: Physician Assistant

## 2020-12-24 DIAGNOSIS — J329 Chronic sinusitis, unspecified: Secondary | ICD-10-CM

## 2020-12-24 NOTE — Progress Notes (Signed)
Based on what you shared with me, I feel your condition warrants further evaluation and I recommend that you be seen for a face to face visit.  Please contact your primary care physician practice to be seen. Many offices offer virtual options to be seen via video if you are not comfortable going in person to a medical facility at this time.  Giving recurrence of symptoms after completing treatment course given through e-visit, it is our policy that you be evaluated in person to make sure no further assessment is needed and to make sure appropriate treatment is given.   NOTE: You will NOT be charged for this eVisit.  If you do not have a PCP, [facility] offers a free physician referral service available at [phone]. Our trained staff has the experience, knowledge and resources to put you in touch with a physician who is right for you.    If you are having a true medical emergency please call 911.   Your e-visit answers were reviewed by a board certified advanced clinical practitioner to complete your personal care plan.  Thank you for using e-Visits.

## 2021-04-08 ENCOUNTER — Other Ambulatory Visit: Payer: Self-pay | Admitting: Obstetrics and Gynecology

## 2021-04-08 DIAGNOSIS — N6310 Unspecified lump in the right breast, unspecified quadrant: Secondary | ICD-10-CM

## 2021-04-16 ENCOUNTER — Other Ambulatory Visit: Payer: Self-pay

## 2021-04-16 ENCOUNTER — Ambulatory Visit
Admission: RE | Admit: 2021-04-16 | Discharge: 2021-04-16 | Disposition: A | Discharge status: Home / Self Care | Payer: 59 | Source: Ambulatory Visit | Attending: Obstetrics and Gynecology | Admitting: Obstetrics and Gynecology

## 2021-04-16 DIAGNOSIS — N6310 Unspecified lump in the right breast, unspecified quadrant: Secondary | ICD-10-CM

## 2021-05-19 ENCOUNTER — Other Ambulatory Visit: Payer: 59

## 2022-03-02 ENCOUNTER — Encounter: Payer: Self-pay | Admitting: *Deleted

## 2022-05-21 ENCOUNTER — Encounter: Payer: Self-pay | Admitting: *Deleted
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
## 2022-10-20 ENCOUNTER — Ambulatory Visit: Payer: 59 | Admitting: Physician Assistant

## 2022-10-20 ENCOUNTER — Encounter: Payer: Self-pay | Admitting: Physician Assistant

## 2022-10-20 VITALS — BP 130/80 | HR 84 | Temp 97.8°F | Ht 72.0 in | Wt 165.0 lb

## 2022-10-20 DIAGNOSIS — G43809 Other migraine, not intractable, without status migrainosus: Secondary | ICD-10-CM

## 2022-10-20 DIAGNOSIS — Z0000 Encounter for general adult medical examination without abnormal findings: Secondary | ICD-10-CM | POA: Diagnosis not present

## 2022-10-20 DIAGNOSIS — Z1159 Encounter for screening for other viral diseases: Secondary | ICD-10-CM

## 2022-10-20 DIAGNOSIS — Z13220 Encounter for screening for lipoid disorders: Secondary | ICD-10-CM | POA: Diagnosis not present

## 2022-10-20 DIAGNOSIS — Z136 Encounter for screening for cardiovascular disorders: Secondary | ICD-10-CM | POA: Diagnosis not present

## 2022-10-20 LAB — LIPID PANEL
Cholesterol: 178 mg/dL (ref 0–200)
HDL: 44.9 mg/dL (ref >39.00)
LDL Cholesterol: 100 mg/dL — ABNORMAL HIGH (ref 0–99)
NonHDL: 133.27
Total CHOL/HDL Ratio: 4
Triglycerides: 165 mg/dL — ABNORMAL HIGH (ref 0.0–149.0)
VLDL: 33 mg/dL (ref 0.0–40.0)

## 2022-10-20 LAB — CBC WITH DIFFERENTIAL/PLATELET
Basophils Absolute: 0 10*3/uL (ref 0.0–0.1)
Basophils Relative: 0.3 % (ref 0.0–3.0)
Eosinophils Absolute: 0 10*3/uL (ref 0.0–0.7)
Eosinophils Relative: 0.6 % (ref 0.0–5.0)
HCT: 39.4 % (ref 36.0–46.0)
Hemoglobin: 13.3 g/dL (ref 12.0–15.0)
Lymphocytes Relative: 40.6 % (ref 12.0–46.0)
Lymphs Abs: 2.8 10*3/uL (ref 0.7–4.0)
MCHC: 33.9 g/dL (ref 30.0–36.0)
MCV: 89.9 fl (ref 78.0–100.0)
Monocytes Absolute: 0.3 10*3/uL (ref 0.1–1.0)
Monocytes Relative: 5.1 % (ref 3.0–12.0)
Neutro Abs: 3.6 10*3/uL (ref 1.4–7.7)
Neutrophils Relative %: 53.4 % (ref 43.0–77.0)
Platelets: 279 10*3/uL (ref 150.0–400.0)
RBC: 4.38 Mil/uL (ref 3.87–5.11)
RDW: 12.3 % (ref 11.5–15.5)
WBC: 6.8 10*3/uL (ref 4.0–10.5)

## 2022-10-20 LAB — COMPREHENSIVE METABOLIC PANEL
ALT: 16 U/L (ref 0–35)
AST: 22 U/L (ref 0–37)
Albumin: 4.5 g/dL (ref 3.5–5.2)
Alkaline Phosphatase: 54 U/L (ref 39–117)
BUN: 10 mg/dL (ref 6–23)
CO2: 30 mEq/L (ref 19–32)
Calcium: 10.1 mg/dL (ref 8.4–10.5)
Chloride: 102 mEq/L (ref 96–112)
Creatinine, Ser: 0.78 mg/dL (ref 0.40–1.20)
GFR: 97.14 mL/min (ref >60.00)
Glucose, Bld: 96 mg/dL (ref 70–99)
Potassium: 4 mEq/L (ref 3.5–5.1)
Sodium: 139 mEq/L (ref 135–145)
Total Bilirubin: 0.5 mg/dL (ref 0.2–1.2)
Total Protein: 7.1 g/dL (ref 6.0–8.3)

## 2022-10-20 NOTE — Patient Instructions (Signed)
It was great to see you!  Will place referral to neurology  Please go to the lab for blood work.   Our office will call you with your results unless you have chosen to receive results via MyChart.  If your blood work is normal we will follow-up each year for physicals and as scheduled for chronic medical problems.  If anything is abnormal we will treat accordingly and get you in for a follow-up.  Take care,  Lisa [name]
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
## 2022-10-21 ENCOUNTER — Encounter: Payer: Self-pay | Admitting: Physician Assistant

## 2022-10-21 LAB — HEPATITIS C ANTIBODY: Hepatitis C Ab: NONREACTIVE

## 2023-02-21 ENCOUNTER — Telehealth: Payer: 59 | Admitting: Nurse Practitioner

## 2023-02-21 DIAGNOSIS — B9789 Other viral agents as the cause of diseases classified elsewhere: Secondary | ICD-10-CM

## 2023-02-21 DIAGNOSIS — J019 Acute sinusitis, unspecified: Secondary | ICD-10-CM

## 2023-02-21 MED ORDER — AZELASTINE HCL 0.1 % NA SOLN
1.0000 | Freq: Two times a day (BID) | NASAL | 0 refills | Status: DC
Start: 2023-02-21 — End: 2023-10-28

## 2023-02-21 NOTE — Progress Notes (Signed)
E-Visit for Sinus Problems  We are sorry that you are not feeling well. Because it has been 2 days and we do not typically treat with antibiotics this early I recommend to continue conservative care. If you need a work note I will be happy to provide that. If yours symptoms persist after 5-7 days please feel free to reach back out to [place]. I have provided a work note if your mychart that you can use if needed.   You can use the sinus irrigation kits over the counter. Humidifier, etc.   Based on what you have shared with me it looks like you have sinusitis.  Sinusitis is inflammation and infection in the sinus cavities of the head.  Based on your presentation I believe you most likely have Acute Viral Sinusitis.This is an infection most likely caused by a virus. There is not specific treatment for viral sinusitis other than to help you with the symptoms until the infection runs its course.  You may use an oral decongestant such as Mucinex D or if you have glaucoma or high blood pressure use plain Mucinex. Saline nasal spray help and can safely be used as often as needed for congestion, I have prescribed: Fluticasone nasal spray two sprays in each nostril once a day  Some authorities believe that zinc sprays or the use of Echinacea may shorten the course of your symptoms.  Sinus infections are not as easily transmitted as other respiratory infection, however we still recommend that you avoid close contact with loved ones, especially the very young and elderly.  Remember to wash your hands thoroughly throughout the day as this is the number one way to prevent the spread of infection!  Home Care: Only take medications as instructed by your medical team. Do not take these medications with alcohol. A steam or ultrasonic humidifier can help congestion.  You can place a towel over your head and breathe in the steam from hot water coming from a faucet. Avoid close contacts especially the very young and the  elderly. Cover your mouth when you cough or sneeze. Always remember to wash your hands.  Get Help Right Away If: You develop worsening fever or sinus pain. You develop a severe head ache or visual changes. Your symptoms persist after you have completed your treatment plan.  Make sure you Understand these instructions. Will watch your condition. Will get help right away if you are not doing well or get worse.   Thank you for choosing an e-visit.  Your e-visit answers were reviewed by a board certified advanced clinical practitioner to complete your personal care plan. Depending upon the condition, your plan could have included both over the counter or prescription medications.  Please review your pharmacy choice. Make sure the pharmacy is open so you can pick up prescription now. If there is a problem, you may contact your provider through [employer] and have the prescription routed to another pharmacy.  Your safety is important to [place]. If you have drug allergies check your prescription carefully.   For the next 24 hours you can use MyChart to ask questions about today's visit, request a non-urgent call back, or ask for a work or school excuse. You will get an email in the next two days asking about your experience. I hope that your e-visit has been valuable and will speed your recovery.

## 2023-02-21 NOTE — Progress Notes (Signed)
I have spent 5 minutes in review of e-visit questionnaire, review and updating patient chart, medical decision making and response to patient.  ° °Jerrell Mangel W Secilia Apps, NP ° °  °

## 2023-02-24 ENCOUNTER — Telehealth: Payer: 59 | Admitting: Family Medicine

## 2023-02-24 DIAGNOSIS — B9689 Other specified bacterial agents as the cause of diseases classified elsewhere: Secondary | ICD-10-CM

## 2023-02-24 MED ORDER — AMOXICILLIN-POT CLAVULANATE 875-125 MG PO TABS
1.0000 | ORAL_TABLET | Freq: Two times a day (BID) | ORAL | 0 refills | Status: AC
Start: 2023-02-24 — End: 2023-03-03

## 2023-02-24 NOTE — Progress Notes (Signed)
E-Visit for Sinus Problems  We are sorry that you are not feeling well.  Here is how we plan to help! Sounds like you have developed a secondary bacterial sinus infection after having covid.  Based on what you have shared with me it looks like you have sinusitis.  Sinusitis is inflammation and infection in the sinus cavities of the head.  Based on your presentation I believe you most likely have Acute Bacterial Sinusitis.  This is an infection caused by bacteria and is treated with antibiotics. I have prescribed Augmentin 875mg /125mg  one tablet twice daily with food, for 7 days. You may use an oral decongestant such as Mucinex D or if you have glaucoma or high blood pressure use plain Mucinex. Saline nasal spray help and can safely be used as often as needed for congestion.  If you develop worsening sinus pain, fever or notice severe headache and vision changes, or if symptoms are not better after completion of antibiotic, please schedule an appointment with a health care provider.    Sinus infections are not as easily transmitted as other respiratory infection, however we still recommend that you avoid close contact with loved ones, especially the very young and elderly.  Remember to wash your hands thoroughly throughout the day as this is the number one way to prevent the spread of infection!  Home Care: Only take medications as instructed by your medical team. Complete the entire course of an antibiotic. Do not take these medications with alcohol. A steam or ultrasonic humidifier can help congestion.  You can place a towel over your head and breathe in the steam from hot water coming from a faucet. Avoid close contacts especially the very young and the elderly. Cover your mouth when you cough or sneeze. Always remember to wash your hands.  Get Help Right Away If: You develop worsening fever or sinus pain. You develop a severe head ache or visual changes. Your symptoms persist after you have  completed your treatment plan.  Make sure you Understand these instructions. Will watch your condition. Will get help right away if you are not doing well or get worse.  Thank you for choosing an e-visit.  Your e-visit answers were reviewed by a board certified advanced clinical practitioner to complete your personal care plan. Depending upon the condition, your plan could have included both over the counter or prescription medications.  Please review your pharmacy choice. Make sure the pharmacy is open so you can pick up prescription now. If there is a problem, you may contact your provider through [employer] and have the prescription routed to another pharmacy.  Your safety is important to [place]. If you have drug allergies check your prescription carefully.   For the next 24 hours you can use MyChart to ask questions about today's visit, request a non-urgent call back, or ask for a work or school excuse. You will get an email in the next two days asking about your experience. I hope that your e-visit has been valuable and will speed your recovery.  I provided 5 minutes of non face-to-face time during this encounter for chart review, medication and order placement, as well as and documentation.

## 2023-04-26 LAB — HM PAP SMEAR: HPV, high-risk: NEGATIVE

## 2023-05-05 ENCOUNTER — Encounter: Payer: Self-pay | Admitting: Physician Assistant

## 2023-06-09 ENCOUNTER — Telehealth: Payer: Commercial Managed Care - HMO | Admitting: Family Medicine

## 2023-06-09 DIAGNOSIS — J019 Acute sinusitis, unspecified: Secondary | ICD-10-CM

## 2023-06-09 MED ORDER — AMOXICILLIN-POT CLAVULANATE 875-125 MG PO TABS
1.0000 | ORAL_TABLET | Freq: Two times a day (BID) | ORAL | 0 refills | Status: AC
Start: 2023-06-09 — End: 2023-06-16

## 2023-06-09 NOTE — Progress Notes (Signed)

## 2023-06-29 ENCOUNTER — Telehealth: Payer: Commercial Managed Care - HMO | Admitting: Physician Assistant

## 2023-06-29 DIAGNOSIS — J101 Influenza due to other identified influenza virus with other respiratory manifestations: Secondary | ICD-10-CM

## 2023-06-29 MED ORDER — OSELTAMIVIR PHOSPHATE 75 MG PO CAPS
75.0000 mg | ORAL_CAPSULE | Freq: Two times a day (BID) | ORAL | 0 refills | Status: DC
Start: 2023-06-29 — End: 2023-10-28

## 2023-06-29 NOTE — Progress Notes (Signed)
E visit for Flu like symptoms   We are sorry that you are not feeling well.  Here is how we plan to help! Based on what you have shared with me it looks like you may have a respiratory virus that may be influenza.  Influenza or "the flu" is   an infection caused by a respiratory virus. The flu virus is highly contagious and persons who did not receive their yearly flu vaccination may "catch" the flu from close contact.  We have anti-viral medications to treat the viruses that cause this infection. They are not a "cure" and only shorten the course of the infection. These prescriptions are most effective when they are given within the first 2 days of "flu" symptoms. Antiviral medication are indicated if you have a high risk of complications from the flu. You should  also consider an antiviral medication if you are in close contact with someone who is at risk. These medications can help patients avoid complications from the flu  but have side effects that you should know. Possible side effects from Tamiflu or oseltamivir include nausea, vomiting, diarrhea, dizziness, headaches, eye redness, sleep problems or other respiratory symptoms. You should not take Tamiflu if you have an allergy to oseltamivir or any to the ingredients in Tamiflu.  Based upon your symptoms and potential risk factors I have prescribed Oseltamivir (Tamiflu).  It has been sent to your designated pharmacy.  You will take one 75 mg capsule orally twice a day for the next 5 days.  You can still use symptomatic medications of choice as needed.  ANYONE WHO HAS FLU SYMPTOMS SHOULD: Stay home. The flu is highly contagious and going out or to work exposes others! Be sure to drink plenty of fluids. Water is fine as well as fruit juices, sodas and electrolyte beverages. You may want to stay away from caffeine or alcohol. If you are nauseated, try taking small sips of liquids. How do you know if you are getting enough fluid? Your urine should  be a pale yellow or almost colorless. Get rest. Taking a steamy shower or using a humidifier may help nasal congestion and ease sore throat pain. Using a saline nasal spray works much the same way. Cough drops, hard candies and sore throat lozenges may ease your cough. Line up a caregiver. Have someone check on you regularly.   GET HELP RIGHT AWAY IF: You cannot keep down liquids or your medications. You become short of breath Your fell like you are going to pass out or loose consciousness. Your symptoms persist after you have completed your treatment plan MAKE SURE YOU  Understand these instructions. Will watch your condition. Will get help right away if you are not doing well or get worse.  Your e-visit answers were reviewed by a board certified advanced clinical practitioner to complete your personal care plan.  Depending on the condition, your plan could have included both over the counter or prescription medications.  If there is a problem please reply  once you have received a response from your provider.  Your safety is important to [place].  If you have drug allergies check your prescription carefully.    You can use MyChart to ask questions about today's visit, request a non-urgent call back, or ask for a work or school excuse for 24 hours related to this e-Visit. If it has been greater than 24 hours you will need to follow up with your provider, or enter a new e-Visit to address  those concerns.  You will get an e-mail in the next two days asking about your experience.  I hope that your e-visit has been valuable and will speed your recovery. Thank you for using e-visits.    I have spent 5 minutes in review of e-visit questionnaire, review and updating patient chart, medical decision making and response to patient.   Margaretann Loveless, PA-C

## 2023-08-04 ENCOUNTER — Telehealth: Payer: Commercial Managed Care - HMO | Admitting: Physician Assistant

## 2023-08-04 DIAGNOSIS — B9689 Other specified bacterial agents as the cause of diseases classified elsewhere: Secondary | ICD-10-CM

## 2023-08-04 DIAGNOSIS — J019 Acute sinusitis, unspecified: Secondary | ICD-10-CM

## 2023-08-04 MED ORDER — DOXYCYCLINE HYCLATE 100 MG PO TABS
100.0000 mg | ORAL_TABLET | Freq: Two times a day (BID) | ORAL | 0 refills | Status: DC
Start: 2023-08-04 — End: 2023-10-28

## 2023-08-04 NOTE — Progress Notes (Signed)

## 2023-10-03 ENCOUNTER — Encounter: Payer: Self-pay | Admitting: Physician Assistant

## 2023-10-03 ENCOUNTER — Telehealth: Admitting: Physician Assistant

## 2023-10-03 DIAGNOSIS — M5442 Lumbago with sciatica, left side: Secondary | ICD-10-CM | POA: Diagnosis not present

## 2023-10-03 DIAGNOSIS — M5441 Lumbago with sciatica, right side: Secondary | ICD-10-CM | POA: Diagnosis not present

## 2023-10-03 MED ORDER — NAPROXEN 500 MG PO TABS
500.0000 mg | ORAL_TABLET | Freq: Two times a day (BID) | ORAL | 0 refills | Status: AC
Start: 2023-10-03 — End: ?

## 2023-10-03 MED ORDER — CYCLOBENZAPRINE HCL 10 MG PO TABS
10.0000 mg | ORAL_TABLET | Freq: Three times a day (TID) | ORAL | 0 refills | Status: AC | PRN
Start: 2023-10-03 — End: ?

## 2023-10-03 NOTE — Progress Notes (Signed)
 E-Visit for Back Pain    We are sorry that you are not feeling well.  Here is how we plan to help!   Based on what you have shared with me it looks like you mostly have acute back pain.   Acute back pain is defined as musculoskeletal pain that can resolve in 1-3 weeks with conservative treatment.   I have prescribed Naprosyn 500 mg take one by mouth twice a day non-steroid anti-inflammatory (NSAID) as well as Flexeril 10 mg every eight hours as needed which is a muscle relaxer  Some patients experience stomach irritation or in increased heartburn with anti-inflammatory drugs.  Please keep in mind that muscle relaxer's can cause fatigue and should not be taken while at work or driving.  Back pain is very common.  The pain often gets better over time.  The cause of back pain is usually not dangerous.  Most people can learn to manage their back pain on their own.   Home Care Stay active.  Start with short walks on flat ground if you can.  Try to walk farther each day. Do not sit, drive or stand in one place for more than 30 minutes.  Do not stay in bed. Do not avoid exercise or work.  Activity can help your back heal faster. Be careful when you bend or lift an object.  Bend at your knees, keep the object close to you, and do not twist. Sleep on a firm mattress.  Lie on your side, and bend your knees.  If you lie on your back, put a pillow under your knees. Only take medicines as told by your doctor. Put ice on the injured area. Put ice in a plastic bag Place a towel between your skin and the bag Leave the ice on for 15-20 minutes, 3-4 times a day for the first 2-3 days. 210 After that, you can switch between ice and heat packs. Ask your doctor about back exercises or massage. Avoid feeling anxious or stressed.  Find good ways to deal with stress, such as exercise.   Get Help Right Way If: Your pain does not go away with rest or medicine. Your pain does not go away in 1 week. You have new  problems. You do not feel well. The pain spreads into your legs. You cannot control when you poop (bowel movement) or pee (urinate) You feel sick to your stomach (nauseous) or throw up (vomit) You have belly (abdominal) pain. You feel like you may pass out (faint). If you develop a fever.   Make Sure you: Understand these instructions. Will watch your condition Will get help right away if you are not doing well or get worse.   Your e-visit answers were reviewed by a board certified advanced clinical practitioner to complete your personal care plan.  Depending on the condition, your plan could have included both over the counter or prescription medications.   If there is a problem please reply  once you have received a response from your provider.   Your safety is important to [place].  If you have drug allergies check your prescription carefully.     You can use MyChart to ask questions about today's visit, request a non-urgent call back, or ask for a work or school excuse for 24 hours related to this e-Visit. If it has been greater than 24 hours you will need to follow up with your provider, or enter a new e-Visit to address those concerns.  You will get an e-mail in the next two days asking about your experience.  I hope that your e-visit has been valuable and will speed your recovery. Thank you for using e-visits.  I have spent 5 minutes in review of e-visit questionnaire, review and updating patient chart, medical decision making and response to patient.   Kasandra Knudsen Mayers, PA-C
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
## 2023-10-28 ENCOUNTER — Ambulatory Visit: Payer: Self-pay | Admitting: Physician Assistant

## 2023-10-28 ENCOUNTER — Ambulatory Visit (INDEPENDENT_AMBULATORY_CARE_PROVIDER_SITE_OTHER): Admitting: Physician Assistant

## 2023-10-28 VITALS — BP 130/80 | HR 90 | Temp 97.9°F | Ht 72.0 in | Wt 175.5 lb

## 2023-10-28 DIAGNOSIS — R635 Abnormal weight gain: Secondary | ICD-10-CM | POA: Diagnosis not present

## 2023-10-28 DIAGNOSIS — M5442 Lumbago with sciatica, left side: Secondary | ICD-10-CM | POA: Diagnosis not present

## 2023-10-28 DIAGNOSIS — M5441 Lumbago with sciatica, right side: Secondary | ICD-10-CM | POA: Diagnosis not present

## 2023-10-28 DIAGNOSIS — Z0000 Encounter for general adult medical examination without abnormal findings: Secondary | ICD-10-CM | POA: Diagnosis not present

## 2023-10-28 LAB — COMPREHENSIVE METABOLIC PANEL WITH GFR
ALT: 18 U/L (ref 0–35)
AST: 20 U/L (ref 0–37)
Albumin: 4.6 g/dL (ref 3.5–5.2)
Alkaline Phosphatase: 52 U/L (ref 39–117)
BUN: 11 mg/dL (ref 6–23)
CO2: 27 meq/L (ref 19–32)
Calcium: 9.5 mg/dL (ref 8.4–10.5)
Chloride: 104 meq/L (ref 96–112)
Creatinine, Ser: 0.75 mg/dL (ref 0.40–1.20)
GFR: 101.1 mL/min (ref >60.00)
Glucose, Bld: 80 mg/dL (ref 70–99)
Potassium: 3.8 meq/L (ref 3.5–5.1)
Sodium: 137 meq/L (ref 135–145)
Total Bilirubin: 0.5 mg/dL (ref 0.2–1.2)
Total Protein: 7.3 g/dL (ref 6.0–8.3)

## 2023-10-28 LAB — CBC WITH DIFFERENTIAL/PLATELET
Basophils Absolute: 0 10*3/uL (ref 0.0–0.1)
Basophils Relative: 0.4 % (ref 0.0–3.0)
Eosinophils Absolute: 0.1 10*3/uL (ref 0.0–0.7)
Eosinophils Relative: 0.9 % (ref 0.0–5.0)
HCT: 40.5 % (ref 36.0–46.0)
Hemoglobin: 13.9 g/dL (ref 12.0–15.0)
Lymphocytes Relative: 35.5 % (ref 12.0–46.0)
Lymphs Abs: 2.2 10*3/uL (ref 0.7–4.0)
MCHC: 34.3 g/dL (ref 30.0–36.0)
MCV: 89.2 fl (ref 78.0–100.0)
Monocytes Absolute: 0.4 10*3/uL (ref 0.1–1.0)
Monocytes Relative: 6.9 % (ref 3.0–12.0)
Neutro Abs: 3.4 10*3/uL (ref 1.4–7.7)
Neutrophils Relative %: 56.3 % (ref 43.0–77.0)
Platelets: 279 10*3/uL (ref 150.0–400.0)
RBC: 4.54 Mil/uL (ref 3.87–5.11)
RDW: 12.5 % (ref 11.5–15.5)
WBC: 6.1 10*3/uL (ref 4.0–10.5)

## 2023-10-28 LAB — LIPID PANEL
Cholesterol: 195 mg/dL (ref 0–200)
HDL: 44.5 mg/dL (ref >39.00)
LDL Cholesterol: 105 mg/dL — ABNORMAL HIGH (ref 0–99)
NonHDL: 150.54
Total CHOL/HDL Ratio: 4
Triglycerides: 229 mg/dL — ABNORMAL HIGH (ref 0.0–149.0)
VLDL: 45.8 mg/dL — ABNORMAL HIGH (ref 0.0–40.0)

## 2023-10-28 LAB — TSH: TSH: 2.22 u[IU]/mL (ref 0.35–5.50)

## 2023-10-28 NOTE — Patient Instructions (Addendum)
 It was great to see you!  Please go to the lab for blood work.   Our office will call you with your results unless you have chosen to receive results via MyChart.  If your blood work is normal we will follow-up each year for physicals and as scheduled for chronic medical problems.  If anything is abnormal we will treat accordingly and get you in for a follow-up.  Take care,  Lisa [name]

## 2024-06-05 ENCOUNTER — Telehealth: Admitting: Physician Assistant

## 2024-06-05 DIAGNOSIS — B9689 Other specified bacterial agents as the cause of diseases classified elsewhere: Secondary | ICD-10-CM | POA: Diagnosis not present

## 2024-06-05 DIAGNOSIS — J019 Acute sinusitis, unspecified: Secondary | ICD-10-CM | POA: Diagnosis not present

## 2024-06-05 MED ORDER — AMOXICILLIN-POT CLAVULANATE 875-125 MG PO TABS: 1.0000 | ORAL_TABLET | Freq: Two times a day (BID) | ORAL | 0 refills | Status: AC

## 2024-06-05 NOTE — Progress Notes (Signed)
